# Patient Record
Sex: Female | Born: 1950 | Hispanic: Yes | Smoking: Never smoker
Health system: Southern US, Community
[De-identification: ages and names within clinical notes are randomized; demographics above are authoritative.]

## PROBLEM LIST (undated history)

## (undated) DIAGNOSIS — J8410 Pulmonary fibrosis, unspecified: Secondary | ICD-10-CM

## (undated) DIAGNOSIS — M329 Systemic lupus erythematosus, unspecified: Secondary | ICD-10-CM

## (undated) DIAGNOSIS — I10 Essential (primary) hypertension: Secondary | ICD-10-CM

## (undated) DIAGNOSIS — IMO0002 Reserved for concepts with insufficient information to code with codable children: Secondary | ICD-10-CM

## (undated) DIAGNOSIS — M069 Rheumatoid arthritis, unspecified: Secondary | ICD-10-CM

## (undated) HISTORY — PX: OTHER SURGICAL HISTORY: SHX169

---

## 2015-06-26 ENCOUNTER — Emergency Department (INDEPENDENT_AMBULATORY_CARE_PROVIDER_SITE_OTHER): Payer: Medicare Other

## 2015-06-26 ENCOUNTER — Emergency Department (HOSPITAL_COMMUNITY)
Admission: EM | Admit: 2015-06-26 | Discharge: 2015-06-26 | Disposition: A | Discharge status: Home / Self Care | Payer: Medicare Other | Source: Home / Self Care | Attending: Emergency Medicine | Admitting: Emergency Medicine

## 2015-06-26 ENCOUNTER — Encounter (HOSPITAL_COMMUNITY): Payer: Self-pay | Admitting: Emergency Medicine

## 2015-06-26 DIAGNOSIS — J189 Pneumonia, unspecified organism: Secondary | ICD-10-CM

## 2015-06-26 HISTORY — DX: Reserved for concepts with insufficient information to code with codable children: IMO0002

## 2015-06-26 HISTORY — DX: Rheumatoid arthritis, unspecified: M06.9

## 2015-06-26 HISTORY — DX: Essential (primary) hypertension: I10

## 2015-06-26 HISTORY — DX: Pulmonary fibrosis, unspecified: J84.10

## 2015-06-26 HISTORY — DX: Systemic lupus erythematosus, unspecified: M32.9

## 2015-06-26 MED ORDER — METHYLPREDNISOLONE SODIUM SUCC 125 MG IJ SOLR
INTRAMUSCULAR | Status: AC
  Filled 2015-06-26: qty 2

## 2015-06-26 MED ORDER — METHYLPREDNISOLONE SODIUM SUCC 125 MG IJ SOLR
125.0000 mg | Freq: Once | INTRAMUSCULAR | Status: AC
  Administered 2015-06-26: 125 mg via INTRAMUSCULAR

## 2015-06-26 MED ORDER — LEVOFLOXACIN 500 MG PO TABS: 500.0000 mg | ORAL_TABLET | Freq: Every day | ORAL | Status: DC

## 2015-06-26 NOTE — ED Provider Notes (Signed)
CSN: [id number]     Arrival date & time 06/26/15  1807 History   First MD Initiated Contact with Patient 06/26/15 1821     Chief Complaint  Patient presents with  . Cough   (Consider location/radiation/quality/duration/timing/severity/associated sxs/prior Treatment) Patient is a 64 y.o. female presenting with cough. The history is provided by the patient. No language interpreter was used.  Cough Cough characteristics:  Productive Sputum characteristics:  Green Severity:  Moderate Onset quality:  Gradual Duration:  1 week Timing:  Constant Progression:  Worsening Chronicity:  Recurrent Smoker: no   Context: upper respiratory infection   Relieved by:  Nothing Worsened by:  Nothing tried Ineffective treatments:  None tried Associated symptoms: shortness of breath and sinus congestion   Pt has pulmonary fibrosis.  Pt is on duonebs, augmentin and prednisone  Past Medical History  Diagnosis Date  . Hypertension   . Lupus (HCC)   . Pulmonary fibrosis (HCC)   . Rheumatoid arthritis [facility])    Past Surgical History  Procedure Laterality Date  . Right knee replacement     No family history on file. Social History  Substance Use Topics  . Smoking status: Never Smoker   . Smokeless tobacco: None  . Alcohol Use: No   OB History    No data available     Review of Systems  Respiratory: Positive for cough and shortness of breath.   All other systems reviewed and are negative.   Allergies  Vancomycin  Home Medications   Prior to Admission medications   Medication Sig Start Date End Date Taking? Authorizing Provider  budesonide-formoterol (SYMBICORT) 160-4.5 MCG/ACT inhaler Inhale 2 puffs into the lungs 2 (two) times daily.   Yes Historical Provider, MD  celecoxib (CELEBREX) 200 MG capsule Take 200 mg by mouth 2 (two) times daily.   Yes Historical Provider, MD  clopidogrel (PLAVIX) 75 MG tablet Take 75 mg by mouth daily.   Yes Historical Provider, MD  folic acid (FOLVITE) 1  MG tablet Take 1 mg by mouth daily.   Yes Historical Provider, MD  hydroxychloroquine (PLAQUENIL) 200 MG tablet Take by mouth daily.   Yes Historical Provider, MD  ipratropium-albuterol (DUONEB) 0.5-2.5 (3) MG/3ML SOLN Take 3 mLs by nebulization every 2 (two) hours as needed.   Yes Historical Provider, MD  leflunomide (ARAVA) 20 MG tablet Take 20 mg by mouth daily.   Yes Historical Provider, MD  levothyroxine (SYNTHROID, LEVOTHROID) 100 MCG tablet Take 100 mcg by mouth daily before breakfast.   Yes Historical Provider, MD  pantoprazole (PROTONIX) 20 MG tablet Take 20 mg by mouth daily.   Yes Historical Provider, MD  predniSONE (DELTASONE) 5 MG tablet Take 5 mg by mouth daily with breakfast.   Yes Historical Provider, MD   Meds Ordered and Administered this Visit  Medications - No data to display  BP 123/69 mmHg  Pulse 80  Temp(Src) 97.5 F (36.4 C) (Oral)  Resp 18  SpO2 97% No data found.   Physical Exam  Constitutional: She is oriented to person, place, and time. She appears well-developed and well-nourished.  HENT:  Head: Normocephalic.  Right Ear: External ear normal.  Mouth/Throat: Oropharynx is clear and moist.  Eyes: EOM are normal. Pupils are equal, round, and reactive to light.  Neck: Normal range of motion.  Cardiovascular: Normal rate and normal heart sounds.   Pulmonary/Chest: Effort normal.  Rhonchi, harsh breath sounds  Abdominal: Soft. She exhibits no distension.  Musculoskeletal: Normal range of motion.  Neurological: She  is alert and oriented to person, place, and time.  Skin: Skin is warm.  Psychiatric: She has a normal mood and affect.  Nursing note and vitals reviewed.   ED Course  Procedures (including critical care time)  Labs Review Labs Reviewed - No data to display  Imaging Review Dg Chest 2 View  06/26/2015   CLINICAL DATA:  Cough, history of pulmonary fibrosis  EXAM: CHEST  2 VIEW  COMPARISON:  None.  FINDINGS: There is bibasilar airspace  disease. There is no other focal parenchymal opacity. There is no pleural effusion or pneumothorax. The heart and mediastinal contours are unremarkable.  The osseous structures are unremarkable.  IMPRESSION: Bibasilar airspace disease which may reflect pneumonia versus fibrosis.   Electronically Signed   By: Elige Ko   On: 06/26/2015 19:48     Visual Acuity Review  Right Eye Distance:   Left Eye Distance:   Bilateral Distance:    Right Eye Near:   Left Eye Near:    Bilateral Near:         MDM  Pt may have pneumonia on chest xray.   I will treat with levaquin.  Pt given solumedrol Im.   Pt advised to see her Md as soon as possible for recheck.  Go to ED if symptoms worsen or cahnge.   1. Community acquired pneumonia    Solumedrol IM Levaquin x 10 days    Elson Areas, PA-C 06/26/15 2006

## 2015-06-26 NOTE — Discharge Instructions (Signed)

## 2015-06-26 NOTE — ED Notes (Signed)
Pt here with c/o frequent productive cough with greenish phlegm and worsening SOB at night since last Sunday Pt is here visiting son from [place]. States, she is taking Augmentin, Symbicort and using duo/nebs Q 2hrs per Hx Lupus, Pulmonary Fibrosis. Denies chest pain or fever,chills Last treatment 2 hrs ago, no distress noted

## 2018-03-30 ENCOUNTER — Encounter (HOSPITAL_COMMUNITY): Payer: Self-pay | Admitting: Emergency Medicine

## 2018-03-30 ENCOUNTER — Emergency Department (HOSPITAL_COMMUNITY): Payer: Medicare Other

## 2018-03-30 ENCOUNTER — Observation Stay (HOSPITAL_COMMUNITY)
Admission: EM | Admit: 2018-03-30 | Discharge: 2018-03-31 | Disposition: A | Discharge status: Home / Self Care | Payer: Medicare Other | Attending: Internal Medicine | Admitting: Internal Medicine

## 2018-03-30 ENCOUNTER — Other Ambulatory Visit: Payer: Self-pay

## 2018-03-30 DIAGNOSIS — R05 Cough: Secondary | ICD-10-CM | POA: Insufficient documentation

## 2018-03-30 DIAGNOSIS — I272 Pulmonary hypertension, unspecified: Secondary | ICD-10-CM

## 2018-03-30 DIAGNOSIS — M3210 Systemic lupus erythematosus, organ or system involvement unspecified: Secondary | ICD-10-CM | POA: Insufficient documentation

## 2018-03-30 DIAGNOSIS — R079 Chest pain, unspecified: Secondary | ICD-10-CM | POA: Diagnosis not present

## 2018-03-30 DIAGNOSIS — I10 Essential (primary) hypertension: Secondary | ICD-10-CM | POA: Diagnosis not present

## 2018-03-30 DIAGNOSIS — J189 Pneumonia, unspecified organism: Secondary | ICD-10-CM | POA: Diagnosis not present

## 2018-03-30 DIAGNOSIS — Z79899 Other long term (current) drug therapy: Secondary | ICD-10-CM | POA: Diagnosis not present

## 2018-03-30 DIAGNOSIS — Z7902 Long term (current) use of antithrombotics/antiplatelets: Secondary | ICD-10-CM | POA: Insufficient documentation

## 2018-03-30 DIAGNOSIS — M069 Rheumatoid arthritis, unspecified: Secondary | ICD-10-CM | POA: Insufficient documentation

## 2018-03-30 DIAGNOSIS — J8410 Pulmonary fibrosis, unspecified: Secondary | ICD-10-CM

## 2018-03-30 LAB — BASIC METABOLIC PANEL
Anion gap: 4 — ABNORMAL LOW (ref 5–15)
BUN: 18 mg/dL (ref 8–23)
CO2: 26 mmol/L (ref 22–32)
CREATININE: 0.54 mg/dL (ref 0.44–1.00)
Calcium: 8.3 mg/dL — ABNORMAL LOW (ref 8.9–10.3)
Chloride: 108 mmol/L (ref 98–111)
GFR calc Af Amer: >60 mL/min (ref >60)
Glucose, Bld: 82 mg/dL (ref 70–99)
Potassium: 2.9 mmol/L — ABNORMAL LOW (ref 3.5–5.1)
Sodium: 138 mmol/L (ref 135–145)

## 2018-03-30 LAB — CBC
HEMATOCRIT: 39.4 % (ref 36.0–46.0)
Hemoglobin: 12.4 g/dL (ref 12.0–15.0)
MCH: 29.2 pg (ref 26.0–34.0)
MCHC: 31.5 g/dL (ref 30.0–36.0)
MCV: 92.9 fL (ref 78.0–100.0)
Platelets: 295 10*3/uL (ref 150–400)
RBC: 4.24 MIL/uL (ref 3.87–5.11)
RDW: 13.2 % (ref 11.5–15.5)
WBC: 6.8 10*3/uL (ref 4.0–10.5)

## 2018-03-30 LAB — I-STAT TROPONIN, ED: TROPONIN I, POC: 0.02 ng/mL (ref 0.00–0.08)

## 2018-03-30 LAB — C-REACTIVE PROTEIN: CRP: 4.8 mg/dL — AB (ref <1.0)

## 2018-03-30 LAB — SEDIMENTATION RATE: SED RATE: 14 mm/h (ref 0–22)

## 2018-03-30 LAB — TROPONIN I: Troponin I: <0.03 ng/mL (ref <0.03)

## 2018-03-30 LAB — LACTIC ACID, PLASMA
Lactic Acid, Venous: 1.5 mmol/L (ref 0.5–1.9)
Lactic Acid, Venous: 1.7 mmol/L (ref 0.5–1.9)

## 2018-03-30 MED ORDER — PREDNISONE 20 MG PO TABS
20.0000 mg | ORAL_TABLET | Freq: Every day | ORAL | Status: DC
  Administered 2018-03-31: 20 mg via ORAL
  Filled 2018-03-30: qty 1

## 2018-03-30 MED ORDER — POTASSIUM CHLORIDE CRYS ER 20 MEQ PO TBCR
40.0000 meq | EXTENDED_RELEASE_TABLET | Freq: Once | ORAL | Status: AC
  Administered 2018-03-30: 40 meq via ORAL
  Filled 2018-03-30: qty 2

## 2018-03-30 MED ORDER — HYDROXYCHLOROQUINE SULFATE 200 MG PO TABS
400.0000 mg | ORAL_TABLET | Freq: Every day | ORAL | Status: DC
  Administered 2018-03-31: 400 mg via ORAL
  Filled 2018-03-30: qty 2

## 2018-03-30 MED ORDER — CLOPIDOGREL BISULFATE 75 MG PO TABS
75.0000 mg | ORAL_TABLET | Freq: Every day | ORAL | Status: DC
  Administered 2018-03-31: 75 mg via ORAL
  Filled 2018-03-30: qty 1

## 2018-03-30 MED ORDER — MORPHINE SULFATE (PF) 4 MG/ML IV SOLN
2.0000 mg | Freq: Once | INTRAVENOUS | Status: AC
  Administered 2018-03-30: 2 mg via INTRAVENOUS
  Filled 2018-03-30: qty 1

## 2018-03-30 MED ORDER — IOPAMIDOL (ISOVUE-370) INJECTION 76%
80.0000 mL | Freq: Once | INTRAVENOUS | Status: AC | PRN
  Administered 2018-03-30: 100 mL via INTRAVENOUS

## 2018-03-30 MED ORDER — CELECOXIB 200 MG PO CAPS
200.0000 mg | ORAL_CAPSULE | Freq: Two times a day (BID) | ORAL | Status: DC
  Administered 2018-03-30 – 2018-03-31 (×2): 200 mg via ORAL
  Filled 2018-03-30 (×3): qty 1

## 2018-03-30 MED ORDER — IPRATROPIUM BROMIDE 0.02 % IN SOLN: 0.5000 mg | Freq: Two times a day (BID) | RESPIRATORY_TRACT | Status: DC | PRN

## 2018-03-30 MED ORDER — IOPAMIDOL (ISOVUE-370) INJECTION 76%
INTRAVENOUS | Status: AC
  Filled 2018-03-30: qty 100

## 2018-03-30 MED ORDER — LEFLUNOMIDE 20 MG PO TABS
20.0000 mg | ORAL_TABLET | Freq: Every day | ORAL | Status: DC
  Administered 2018-03-31: 20 mg via ORAL
  Filled 2018-03-30: qty 1

## 2018-03-30 MED ORDER — MOMETASONE FURO-FORMOTEROL FUM 200-5 MCG/ACT IN AERO
2.0000 | INHALATION_SPRAY | Freq: Two times a day (BID) | RESPIRATORY_TRACT | Status: DC
  Administered 2018-03-30: 2 via RESPIRATORY_TRACT
  Filled 2018-03-30: qty 8.8

## 2018-03-30 MED ORDER — LEVOTHYROXINE SODIUM 100 MCG PO TABS
100.0000 ug | ORAL_TABLET | Freq: Every day | ORAL | Status: DC
  Administered 2018-03-31: 100 ug via ORAL
  Filled 2018-03-30: qty 1

## 2018-03-30 MED ORDER — AMLODIPINE BESYLATE 5 MG PO TABS
5.0000 mg | ORAL_TABLET | Freq: Every day | ORAL | Status: DC
  Administered 2018-03-31: 5 mg via ORAL
  Filled 2018-03-30: qty 1

## 2018-03-30 MED ORDER — LEVOFLOXACIN 750 MG PO TABS
750.0000 mg | ORAL_TABLET | Freq: Every day | ORAL | Status: AC
  Administered 2018-03-30: 750 mg via ORAL
  Filled 2018-03-30: qty 1

## 2018-03-30 NOTE — ED Notes (Signed)
Transported to radiology via stretcher per rad tech  

## 2018-03-30 NOTE — H&P (Addendum)
History and Physical    Melody Gregory GXQ:[id number] DOB: 1950/12/27 DOA: 03/30/2018  I have briefly reviewed the patient's prior medical records in [facility]  PCP: Patient, No Pcp Per  Patient coming from: home  Chief Complaint: Chest pain  HPI: Melody Gregory is a 67 y.o. female with medical history significant of pulmonary fibrosis, lupus, rheumatoid arthritis, resides in [place] but currently visiting with her son, presents to the hospital with chief complaint of chest pain.  Patient tells me that she was diagnosed with pneumonia about a week ago when she presented to urgent care with cough and shortness of breath and sinus congestion, she has been on Augmentin for about 5 days, felt like she did not improve, went again yesterday and was changed to Levaquin.  Chest x-ray whether she tells me that she had multifocal pneumonia.  I do not have access to those results.  She comes to the hospital today due to progressive central and right-sided chest pain, worse with deep breath and which seems positional in nature.  She denies any fever chills, she denies any abdominal pain, nausea or vomiting.  She feels like her URI type symptoms with cough and congestion are a little bit better.  She denies any history of coronary artery disease.  She is on prednisone at home, 5 mg, and she self increased the dose to 10mg  given acute illness in her instructions from her PCP in the past.  ED Course: In the emergency room she is afebrile, has no leukocytosis, blood work is relatively unremarkable except for potassium of 2.9.  Lactic acid is normal, troponin is negative.  CT angiogram was negative for PE, also did not show any significant findings consistent with pneumonia.  She does have a very small right-sided pleural effusion.  Review of Systems: As per HPI otherwise 10 point review of systems negative.   Past Medical History:  Diagnosis Date  . Hypertension   . Lupus (HCC)   . Pulmonary fibrosis  (HCC)   . Rheumatoid arthritis (HCC)     Past Surgical History:  Procedure Laterality Date  . right knee replacement       reports that she has never smoked. She does not have any smokeless tobacco history on file. She reports that she does not drink alcohol or use drugs.  Allergies  Allergen Reactions  . Vancomycin Rash    No family history on file.  Prior to Admission medications   Medication Sig Start Date End Date Taking? Authorizing Provider  Adalimumab (HUMIRA) 40 MG/0.4ML PSKT Inject into the skin.   Yes [provider]  amLODipine (NORVASC) 5 MG tablet Take 5 mg by mouth daily.   Yes [provider]  budesonide-formoterol (SYMBICORT) 160-4.5 MCG/ACT inhaler Inhale 2 puffs into the lungs 2 (two) times daily.   Yes [provider]  carboxymethylcellulose (REFRESH PLUS) 0.5 % SOLN Place 1 drop into both eyes as needed (dry eyes).   Yes [provider]  celecoxib (CELEBREX) 200 MG capsule Take 200 mg by mouth 2 (two) times daily.   Yes [provider]  clopidogrel (PLAVIX) 75 MG tablet Take 75 mg by mouth daily.   Yes [provider]  Coenzyme Q10-Fish Oil-Vit E (CO-Q 10 OMEGA-3 FISH OIL) CAPS Take 1 capsule by mouth daily.   Yes [provider]  ergocalciferol (VITAMIN D2) 50000 units capsule Take 50,000 Units by mouth once a week.   Yes [provider]  folic acid (FOLVITE) 1 MG tablet  Take 1 mg by mouth daily.   Yes [provider]  hydroxychloroquine (PLAQUENIL) 200 MG tablet Take 400 mg by mouth daily.    Yes [provider]  ipratropium (ATROVENT) 0.02 % nebulizer solution Take 0.5 mg by nebulization 2 (two) times daily as needed for wheezing or shortness of breath.   Yes [provider]  leflunomide (ARAVA) 20 MG tablet Take 20 mg by mouth daily.   Yes [provider]  levofloxacin (LEVAQUIN) 750 MG tablet Take 750 mg by mouth daily.   Yes [provider]    levothyroxine (SYNTHROID, LEVOTHROID) 100 MCG tablet Take 100 mcg by mouth daily before breakfast.   Yes [provider]  Multiple Vitamins-Minerals (AIRBORNE PO) Take 1 tablet by mouth daily.   Yes [provider]  OXYGEN Inhale 2 L into the lungs at bedtime.   Yes [provider]  pantoprazole (PROTONIX) 20 MG tablet Take 20 mg by mouth daily.   Yes [provider]  predniSONE (DELTASONE) 5 MG tablet Take 5 mg by mouth daily with breakfast.   Yes [provider]    Physical Exam: Vitals:   03/30/18 0950 03/30/18 1000 03/30/18 1100 03/30/18 1232  BP: 125/60 125/61 128/63 125/64  Pulse: 73 71 67 66  Resp: 20 20 (!) 30 20  Temp:      TempSrc:      SpO2: 96% 94% 95% 97%      Constitutional: NAD, calm, comfortable Eyes: PERRL, lids and conjunctivae normal ENMT: Mucous membranes are moist. Neck: normal, supple Respiratory: velcro type sounds bilaterally, no wheezing, no crackles Normal respiratory effort. No accessory muscle use.  Cardiovascular: Regular rate and rhythm, no murmurs / rubs / gallops.  Abdomen: no tenderness, no masses palpated. .  Musculoskeletal: RA changes on hands Skin: no rashes, lesions, ulcers. No induration Neurologic: CN 2-12 grossly intact. Strength 5/5 in all 4.  Psychiatric: Normal judgment and insight. Alert and oriented x 3. Normal mood.   Labs on Admission: I have personally reviewed following labs and imaging studies  CBC: Recent Labs  Lab 03/30/18 0905  WBC 6.8  HGB 12.4  HCT 39.4  MCV 92.9  PLT 295   Basic Metabolic Panel: Recent Labs  Lab 03/30/18 0905  NA 138  K 2.9*  CL 108  CO2 26  GLUCOSE 82  BUN 18  CREATININE 0.54  CALCIUM 8.3*   GFR: CrCl cannot be calculated (Unknown ideal weight.). Liver Function Tests: No results for input(s): AST, ALT, ALKPHOS, BILITOT, PROT, ALBUMIN in the last 168 hours. No results for input(s): LIPASE, AMYLASE in the last 168 hours. No results for  input(s): AMMONIA in the last 168 hours. Coagulation Profile: No results for input(s): INR, PROTIME in the last 168 hours. Cardiac Enzymes: No results for input(s): CKTOTAL, CKMB, CKMBINDEX, TROPONINI in the last 168 hours. BNP (last 3 results) No results for input(s): PROBNP in the last 8760 hours. HbA1C: No results for input(s): HGBA1C in the last 72 hours. CBG: No results for input(s): GLUCAP in the last 168 hours. Lipid Profile: No results for input(s): CHOL, HDL, LDLCALC, TRIG, CHOLHDL, LDLDIRECT in the last 72 hours. Thyroid Function Tests: No results for input(s): TSH, T4TOTAL, FREET4, T3FREE, THYROIDAB in the last 72 hours. Anemia Panel: No results for input(s): VITAMINB12, FOLATE, FERRITIN, TIBC, IRON, RETICCTPCT in the last 72 hours. Urine analysis: No results found for: COLORURINE, APPEARANCEUR, LABSPEC, PHURINE, GLUCOSEU, HGBUR, BILIRUBINUR, KETONESUR, PROTEINUR, UROBILINOGEN, NITRITE, LEUKOCYTESUR   Radiological Exams on Admission:  Dg Chest 2 View  Result Date: 03/30/2018 CLINICAL DATA:  Pneumonia, chest pain EXAM: CHEST - 2 VIEW COMPARISON:  06/26/2015 chest radiograph. FINDINGS: Stable cardiomediastinal silhouette with normal heart size. No pneumothorax. No pleural effusion. No pulmonary edema. Reticular and curvilinear opacities at the lung bases are unchanged. No acute consolidative airspace disease. IMPRESSION: No acute cardiopulmonary disease. Chronic bibasilar reticular and curvilinear opacities suggesting nonspecific scarring or chronic interstitial lung disease. Electronically Signed   By: Delbert Phenix M.D.   On: 03/30/2018 09:40   Ct Angio Chest Pe W/cm &/or Wo Cm  Result Date: 03/30/2018 CLINICAL DATA:  Shortness of breath EXAM: CT ANGIOGRAPHY CHEST WITH CONTRAST TECHNIQUE: Multidetector CT imaging of the chest was performed using the standard protocol during bolus administration of intravenous contrast. Multiplanar CT image reconstructions and MIPs were obtained to  evaluate the vascular anatomy. CONTRAST:  ISOVUE-370 IOPAMIDOL (ISOVUE-370) INJECTION 76% COMPARISON:  Chest radiograph March 30, 2018 FINDINGS: Cardiovascular: There is no demonstrable pulmonary embolus. There is no thoracic aortic aneurysm or dissection. Visualized great vessels appear unremarkable. There are foci of atherosclerotic calcification in the aorta. There are occasional foci of coronary artery calcification. There is no pericardial effusion or pericardial thickening evident. The main pulmonary outflow tract is prominent measuring 3.0 cm in diameter. Mediastinum/Nodes: No thyroid lesions evident. There is no appreciable thoracic adenopathy. Air is present throughout much of the esophagus. Radiopaque material is noted in the stomach and midesophagus, likely indicative of gastroesophageal reflux. Lungs/Pleura: There is a right pleural effusion with atelectatic change in the right base. There is patchy fibrotic change throughout the lungs bilaterally. There is no consolidation. Areas of patchy mosaic attenuation likely represent small airways obstructive disease. Upper Abdomen: Visualized upper abdominal structures appear unremarkable except for calcified granulomas in the spleen. Musculoskeletal: There are no blastic or lytic bone lesions. No chest wall lesions are appreciable. Review of the MIP images confirms the above findings. IMPRESSION: 1. No demonstrable pulmonary embolus. No thoracic aortic aneurysm or dissection. There is aortic atherosclerosis. There are occasional foci of coronary artery calcification. 2. Patchy fibrosis in the lungs. Small right pleural effusion with right base atelectasis. Areas of probable small airways obstructive disease. No frank consolidation. 3.  No evident adenopathy. 4. Air throughout much of the esophagus. Evidence of spontaneous gastroesophageal reflux. 5. Main pulmonary outflow tract is prominent measuring 3.0 cm in diameter. Suspect a degree of underlying  pulmonary arterial hypertension. Aortic Atherosclerosis (ICD10-I70.0). Electronically Signed   By: Bretta Bang III M.D.   On: 03/30/2018 13:21    EKG: Independently reviewed.  Sinus rhythm, right bundle branch block, nonspecific T wave changes  Assessment/Plan Active Problems:   Chest pain   Chest pain -Sounds pleuritic in nature, wonder whether this may represents pericarditis given recent URI type illness.  She also has a right small pleural effusion which can also explain her symptoms. -She does have risk factors for heart disease given rheumatoid arthritis and will cycle cardiac enzymes overnight  -check a 2D echo  Rheumatoid arthritis -On chronic prednisone, increased dose to 20 mg, will need a quick taper  Possible community-acquired pneumonia -She has received 5 days of Augmentin, 1 dose of Levaquin, missed today's dose.  Will give 1 Levaquin today and stop given a total of 7-day course -Patient has been having recurrent "pneumonia" every time she comes to [place].  Advised her to follow-up with her pulmonologist, eventually ask for a immunology allergology referral in case this is allergic in nature rather  than infectious every time she travels outside [place]  Lupus -Continue home medications, check sed rate and CRP, check complements to ensure this is not a lupus flareup  Pulmonary fibrosis -Continue home inhalers  Hypertension -Continue home medications   DVT prophylaxis: SCDs  Code Status: Full code  Family Communication: son present at bedside Disposition Plan: home 1 day pending negative troponin and 2D echo results Consults called: none     Admission status: observation   At the point of initial evaluation, it is my clinical opinion that admission for OBSERVATION is reasonable and necessary because the patient's presenting complaints in the context of their chronic conditions represent sufficient risk of deterioration or significant morbidity to  constitute reasonable grounds for close observation in the hospital setting, but that the patient may be medically stable for discharge from the hospital within 24 to 48 hours.   Pamella Pert, MD [facility] Pager [phone]  If 7PM-7AM, please contact night-coverage [email or web address] Password [email or web address]  03/30/2018, 2:42 PM

## 2018-03-30 NOTE — ED Notes (Signed)
Report called to floor

## 2018-03-30 NOTE — ED Triage Notes (Signed)
Pt reports she was diagnosed with double pneumonia on Friday at urgent care, placed on levaquin but reports that she is having worse chest pain now than before, denies fevers or chills. resp e/u, nad

## 2018-03-30 NOTE — Plan of Care (Signed)
  Problem: Education: Goal: Understanding of cardiac disease, CV risk reduction, and recovery process will improve Outcome: Completed/Met   Problem: Activity: Goal: Ability to tolerate increased activity will improve Outcome: Completed/Met

## 2018-03-30 NOTE — ED Notes (Signed)
Transported to CT scan via stretcher per rad tech  

## 2018-03-30 NOTE — ED Notes (Signed)
Transported from radiology via stretcher per rad tech  

## 2018-03-30 NOTE — ED Provider Notes (Signed)
[facility] [facility] PROGRESSIVE CARE Provider Note   CSN: [id number] Arrival date & time: 03/30/18  0856     History   Chief Complaint Chief Complaint  Patient presents with  . Pneumonia  . Chest Pain    HPI Melody Gregory is a 67 y.o. female.  67 year old female with prior history of lupus, rheumatoid arthritis, and pulmonary fibrosis presents with complaint of chest pain.  Patient reports that she has had ongoing cough for the last week and a half.  She was initially placed on Augmentin until 3 days ago when she was placed on Levaquin.  Patient presents today complaining of continued cough but now with substernal chest discomfort.  This discomfort started this morning.  She denies a prior history of CAD.  The history is provided by the patient.  Chest Pain   This is a new problem. The current episode started 3 to 5 hours ago. The problem occurs rarely. The problem has been gradually improving. The pain is present in the substernal region. The pain is mild. The quality of the pain is described as sharp and pressure-like. The pain does not radiate.    Past Medical History:  Diagnosis Date  . Hypertension   . Lupus (HCC)   . Pulmonary fibrosis (HCC)   . Rheumatoid arthritis [facility])     Patient Active Problem List   Diagnosis Date Noted  . Chest pain 03/30/2018    Past Surgical History:  Procedure Laterality Date  . right knee replacement       OB History   None      Home Medications    Prior to Admission medications   Medication Sig Start Date End Date Taking? Authorizing Provider  Adalimumab (HUMIRA) 40 MG/0.4ML PSKT Inject into the skin.   Yes [provider]  amLODipine (NORVASC) 5 MG tablet Take 5 mg by mouth daily.   Yes [provider]  budesonide-formoterol (SYMBICORT) 160-4.5 MCG/ACT inhaler Inhale 2 puffs into the lungs 2 (two) times daily.   Yes [provider]  carboxymethylcellulose (REFRESH PLUS) 0.5 % SOLN Place 1 drop into  both eyes as needed (dry eyes).   Yes [provider]  celecoxib (CELEBREX) 200 MG capsule Take 200 mg by mouth 2 (two) times daily.   Yes [provider]  clopidogrel (PLAVIX) 75 MG tablet Take 75 mg by mouth daily.   Yes [provider]  Coenzyme Q10-Fish Oil-Vit E (CO-Q 10 OMEGA-3 FISH OIL) CAPS Take 1 capsule by mouth daily.   Yes [provider]  ergocalciferol (VITAMIN D2) 50000 units capsule Take 50,000 Units by mouth once a week.   Yes [provider]  folic acid (FOLVITE) 1 MG tablet Take 1 mg by mouth daily.   Yes [provider]  hydroxychloroquine (PLAQUENIL) 200 MG tablet Take 400 mg by mouth daily.    Yes [provider]  ipratropium (ATROVENT) 0.02 % nebulizer solution Take 0.5 mg by nebulization 2 (two) times daily as needed for wheezing or shortness of breath.   Yes [provider]  leflunomide (ARAVA) 20 MG tablet Take 20 mg by mouth daily.   Yes [provider]  levofloxacin (LEVAQUIN) 750 MG tablet Take 750 mg by mouth daily.   Yes [provider]  levothyroxine (SYNTHROID, LEVOTHROID) 100 MCG tablet Take 100 mcg by mouth daily before breakfast.   Yes [provider]  Multiple Vitamins-Minerals (AIRBORNE PO) Take 1 tablet by mouth daily.   Yes [provider]  OXYGEN Inhale 2 L into the lungs at bedtime.   Yes [provider]  pantoprazole (PROTONIX) 20 MG tablet Take 20 mg by mouth daily.   Yes [provider]  predniSONE (DELTASONE) 5 MG tablet Take 5 mg by mouth daily with breakfast.   Yes [provider]    Family History No family history on file.  Social History Social History   Tobacco Use  . Smoking status: Never Smoker  Substance Use Topics  . Alcohol use: No  . Drug use: No     Allergies   Vancomycin   Review of Systems Review of Systems  Cardiovascular: Positive for chest pain.  All other systems reviewed and are  negative.    Physical Exam Updated Vital Signs BP 129/69 (BP Location: Right Arm)   Pulse 75   Temp 98.1 F (36.7 C) (Oral)   Resp 20   Ht 5\' 2"  (1.575 m)   Wt 51.7 kg (114 lb)   SpO2 96%   BMI 20.85 kg/m   Physical Exam  Constitutional: She is oriented to person, place, and time. She appears well-developed and well-nourished. No distress.  HENT:  Head: Normocephalic and atraumatic.  Mouth/Throat: Oropharynx is clear and moist.  Eyes: Pupils are equal, round, and reactive to light. Conjunctivae and EOM are normal.  Neck: Normal range of motion. Neck supple.  Cardiovascular: Normal rate, regular rhythm and normal heart sounds.  Pulmonary/Chest: Effort normal and breath sounds normal. No respiratory distress.  Abdominal: Soft. She exhibits no distension. There is no tenderness.  Musculoskeletal: Normal range of motion. She exhibits no edema or deformity.  Neurological: She is alert and oriented to person, place, and time.  Skin: Skin is warm and dry.  Psychiatric: She has a normal mood and affect.  Nursing note and vitals reviewed.    ED Treatments / Results  Labs (all labs ordered are listed, but only abnormal results are displayed) Labs Reviewed  BASIC METABOLIC PANEL - Abnormal; Notable for the following components:      Result Value   Potassium 2.9 (*)    Calcium 8.3 (*)    Anion gap 4 (*)    All other components within normal limits  CULTURE, BLOOD (ROUTINE X 2)  CULTURE, BLOOD (ROUTINE X 2)  CBC  LACTIC ACID, PLASMA  LACTIC ACID, PLASMA  HIV ANTIBODY (ROUTINE TESTING)  TROPONIN I  TROPONIN I  TROPONIN I  SEDIMENTATION RATE  C-REACTIVE PROTEIN  C3 COMPLEMENT  C4 COMPLEMENT  I-STAT TROPONIN, ED    EKG EKG Interpretation  Date/Time:  Sunday March 30 2018 08:59:53 EDT Ventricular Rate:  78 PR Interval:  158 QRS Duration: 130 QT Interval:  384 QTC Calculation: 437 R Axis:   112 Text Interpretation:  Normal sinus rhythm Possible Left atrial  enlargement Right bundle branch block T wave abnormality, consider inferior ischemia Abnormal ECG Confirmed by 02-27-1990 [phone]) on 03/30/2018 9:09:25 AM Also confirmed by 05/31/2018 [phone]), editor ([id number] (Josephine Igo)  on 03/30/2018 9:34:09 AM   Radiology Dg Chest 2 View  Result Date: 03/30/2018 CLINICAL DATA:  Pneumonia, chest pain EXAM: CHEST - 2 VIEW COMPARISON:  06/26/2015 chest radiograph. FINDINGS: Stable cardiomediastinal silhouette with normal heart size. No pneumothorax. No pleural effusion. No pulmonary edema. Reticular and curvilinear opacities at the lung bases are unchanged. No acute consolidative airspace disease. IMPRESSION: No acute cardiopulmonary disease. Chronic bibasilar reticular and curvilinear opacities suggesting nonspecific scarring or chronic interstitial lung disease. Electronically Signed   By: 08/26/2015  Poff M.D.   On: 03/30/2018 09:40   Ct Angio Chest Pe W/cm &/or Wo Cm  Result Date: 03/30/2018 CLINICAL DATA:  Shortness of breath EXAM: CT ANGIOGRAPHY CHEST WITH CONTRAST TECHNIQUE: Multidetector CT imaging of the chest was performed using the standard protocol during bolus administration of intravenous contrast. Multiplanar CT image reconstructions and MIPs were obtained to evaluate the vascular anatomy. CONTRAST:  ISOVUE-370 IOPAMIDOL (ISOVUE-370) INJECTION 76% COMPARISON:  Chest radiograph March 30, 2018 FINDINGS: Cardiovascular: There is no demonstrable pulmonary embolus. There is no thoracic aortic aneurysm or dissection. Visualized great vessels appear unremarkable. There are foci of atherosclerotic calcification in the aorta. There are occasional foci of coronary artery calcification. There is no pericardial effusion or pericardial thickening evident. The main pulmonary outflow tract is prominent measuring 3.0 cm in diameter. Mediastinum/Nodes: No thyroid lesions evident. There is no appreciable thoracic adenopathy. Air is present throughout much of the  esophagus. Radiopaque material is noted in the stomach and midesophagus, likely indicative of gastroesophageal reflux. Lungs/Pleura: There is a right pleural effusion with atelectatic change in the right base. There is patchy fibrotic change throughout the lungs bilaterally. There is no consolidation. Areas of patchy mosaic attenuation likely represent small airways obstructive disease. Upper Abdomen: Visualized upper abdominal structures appear unremarkable except for calcified granulomas in the spleen. Musculoskeletal: There are no blastic or lytic bone lesions. No chest wall lesions are appreciable. Review of the MIP images confirms the above findings. IMPRESSION: 1. No demonstrable pulmonary embolus. No thoracic aortic aneurysm or dissection. There is aortic atherosclerosis. There are occasional foci of coronary artery calcification. 2. Patchy fibrosis in the lungs. Small right pleural effusion with right base atelectasis. Areas of probable small airways obstructive disease. No frank consolidation. 3.  No evident adenopathy. 4. Air throughout much of the esophagus. Evidence of spontaneous gastroesophageal reflux. 5. Main pulmonary outflow tract is prominent measuring 3.0 cm in diameter. Suspect a degree of underlying pulmonary arterial hypertension. Aortic Atherosclerosis (ICD10-I70.0). Electronically Signed   By: Bretta Bang III M.D.   On: 03/30/2018 13:21    Procedures Procedures (including critical care time)  Medications Ordered in ED Medications  iopamidol (ISOVUE-370) 76 % injection (has no administration in time range)  amLODipine (NORVASC) tablet 5 mg (has no administration in time range)  mometasone-formoterol (DULERA) 200-5 MCG/ACT inhaler 2 puff (has no administration in time range)  celecoxib (CELEBREX) capsule 200 mg (has no administration in time range)  clopidogrel (PLAVIX) tablet 75 mg (has no administration in time range)  hydroxychloroquine (PLAQUENIL) tablet 400 mg (has no  administration in time range)  ipratropium (ATROVENT) nebulizer solution 0.5 mg (has no administration in time range)  leflunomide (ARAVA) tablet 20 mg (has no administration in time range)  levothyroxine (SYNTHROID, LEVOTHROID) tablet 100 mcg (has no administration in time range)  predniSONE (DELTASONE) tablet 20 mg (20 mg Oral Not Given 03/30/18 1559)  morphine 4 MG/ML injection 2 mg (2 mg Intravenous Given 03/30/18 0949)  potassium chloride SA (K-DUR,KLOR-CON) CR tablet 40 mEq (40 mEq Oral Given 03/30/18 1228)  iopamidol (ISOVUE-370) 76 % injection 80 mL (100 mLs Intravenous Contrast Given 03/30/18 1243)  levofloxacin (LEVAQUIN) tablet 750 mg (750 mg Oral Given 03/30/18 1603)  potassium chloride SA (K-DUR,KLOR-CON) CR tablet 40 mEq (40 mEq Oral Given 03/30/18 1604)     Initial Impression / Assessment and Plan / ED Course  I have reviewed the triage vital signs and the nursing notes.  Pertinent labs & imaging results that were available during my  care of the patient were reviewed by me and considered in my medical decision making (see chart for details).     MDM  Screen Complete  She is presenting with complaint of chest pain.  Initial EKG is without evidence of acute ischemia.  Initial troponin is negative.  Other screening labs are without significant abnormality.  CTA of chest does not demonstrate PE.  No evidence of pneumonia on CT either.   Noted hypokalemia was treated in the ED.   Patient will be admitted to the hospitalist service for further evaluation and work-up of chest pain.   Final Clinical Impressions(s) / ED Diagnoses   Final diagnoses:  Chest pain, unspecified type    ED Discharge Orders    None       Wynetta Fines, MD 03/30/18 [phone]

## 2018-03-31 ENCOUNTER — Telehealth: Payer: Self-pay | Admitting: Physician Assistant

## 2018-03-31 ENCOUNTER — Encounter (HOSPITAL_COMMUNITY): Payer: Self-pay

## 2018-03-31 ENCOUNTER — Observation Stay (HOSPITAL_BASED_OUTPATIENT_CLINIC_OR_DEPARTMENT_OTHER): Payer: Medicare Other

## 2018-03-31 DIAGNOSIS — R079 Chest pain, unspecified: Secondary | ICD-10-CM | POA: Diagnosis not present

## 2018-03-31 DIAGNOSIS — J189 Pneumonia, unspecified organism: Secondary | ICD-10-CM | POA: Diagnosis not present

## 2018-03-31 DIAGNOSIS — I5030 Unspecified diastolic (congestive) heart failure: Secondary | ICD-10-CM

## 2018-03-31 DIAGNOSIS — I272 Pulmonary hypertension, unspecified: Secondary | ICD-10-CM

## 2018-03-31 DIAGNOSIS — J8410 Pulmonary fibrosis, unspecified: Secondary | ICD-10-CM

## 2018-03-31 DIAGNOSIS — R0781 Pleurodynia: Secondary | ICD-10-CM | POA: Diagnosis not present

## 2018-03-31 LAB — LIPID PANEL
Cholesterol: 195 mg/dL (ref 0–200)
HDL: 85 mg/dL (ref >40)
LDL CALC: 101 mg/dL — AB (ref 0–99)
Total CHOL/HDL Ratio: 2.3 RATIO
Triglycerides: 47 mg/dL (ref <150)
VLDL: 9 mg/dL (ref 0–40)

## 2018-03-31 LAB — BASIC METABOLIC PANEL
ANION GAP: 5 (ref 5–15)
BUN: 9 mg/dL (ref 8–23)
CALCIUM: 8.7 mg/dL — AB (ref 8.9–10.3)
CO2: 27 mmol/L (ref 22–32)
CREATININE: 0.44 mg/dL (ref 0.44–1.00)
Chloride: 110 mmol/L (ref 98–111)
GFR calc Af Amer: >60 mL/min (ref >60)
GLUCOSE: 86 mg/dL (ref 70–99)
Potassium: 3.9 mmol/L (ref 3.5–5.1)
Sodium: 142 mmol/L (ref 135–145)

## 2018-03-31 LAB — C4 COMPLEMENT: COMPLEMENT C4, BODY FLUID: 21 mg/dL (ref 14–44)

## 2018-03-31 LAB — ECHOCARDIOGRAM COMPLETE
HEIGHTINCHES: 62 in
Weight: 1833.6 oz

## 2018-03-31 LAB — C3 COMPLEMENT: C3 COMPLEMENT: 109 mg/dL (ref 82–167)

## 2018-03-31 LAB — TROPONIN I: Troponin I: <0.03 ng/mL (ref <0.03)

## 2018-03-31 LAB — HIV ANTIBODY (ROUTINE TESTING W REFLEX): HIV Screen 4th Generation wRfx: NONREACTIVE

## 2018-03-31 MED ORDER — ALUM & MAG HYDROXIDE-SIMETH 200-200-20 MG/5ML PO SUSP
30.0000 mL | ORAL | Status: DC | PRN
  Administered 2018-03-31 (×2): 30 mL via ORAL
  Filled 2018-03-31 (×2): qty 30

## 2018-03-31 MED ORDER — ROSUVASTATIN CALCIUM 10 MG PO TABS: 5.0000 mg | ORAL_TABLET | Freq: Every day | ORAL | 11 refills | Status: AC

## 2018-03-31 MED ORDER — CELECOXIB 200 MG PO CAPS: 200.0000 mg | ORAL_CAPSULE | Freq: Two times a day (BID) | ORAL | 0 refills | Status: AC

## 2018-03-31 NOTE — Progress Notes (Deleted)
PROGRESS NOTE    Mayrin Schmuck  PIR:[id number] DOB: 1950/10/14 DOA: 03/30/2018 PCP: Patient, No Pcp Per   Brief Narrative: Assessment & Plan:   Active Problems:   Pleuritic chest pain   Pulmonary hypertension, unspecified (HCC)   Pulmonary fibrosis (HCC)   Community acquired pneumonia of right lung     DVT prophylaxis:Code Status:  Family Communication: Disposition Plan:   Consultants:    Procedures: Antimicrobials:  Subjective:   Objective: Vitals:   03/30/18 1617 03/30/18 1922 03/31/18 0454 03/31/18 0743  BP: 129/69 112/62 (!) 148/69 (!) 144/71  Pulse: 75 66 68 64  Resp:  18 (!) 22 16  Temp: 98.1 F (36.7 C) 98.1 F (36.7 C) 98.5 F (36.9 C)   TempSrc: Oral Oral Oral   SpO2: 96% 95% 94%   Weight: 51.7 kg (114 lb)  52 kg (114 lb 9.6 oz)   Height: 5\' 2"  (1.575 m)       Intake/Output Summary (Last 24 hours) at 03/31/2018 1203 Last data filed at 03/30/2018 2110 Gross per 24 hour  Intake 600 ml  Output -  Net 600 ml   Filed Weights   03/30/18 1617 03/31/18 0454  Weight: 51.7 kg (114 lb) 52 kg (114 lb 9.6 oz)    Examination:  General exam: Appears calm and comfortable  Respiratory system: Clear to auscultation. Respiratory effort normal. Cardiovascular system: S1 & S2 heard, RRR. No JVD, murmurs, rubs, gallops or clicks. No pedal edema. Gastrointestinal system: Abdomen is nondistended, soft and nontender. No organomegaly or masses felt. Normal bowel sounds heard. Central nervous system: Alert and oriented. No focal neurological deficits. Extremities: Symmetric 5 x 5 power. Skin: No rashes, lesions or ulcers Psychiatry: Judgement and insight appear normal. Mood & affect appropriate.     Data Reviewed: I have personally reviewed following labs and imaging studies  CBC: Recent Labs  Lab 03/30/18 0905  WBC 6.8  HGB 12.4  HCT 39.4  MCV 92.9  PLT 295   Basic Metabolic Panel: Recent Labs  Lab 03/30/18 0905 03/31/18 0713  NA 138 142  K 2.9*  3.9  CL 108 110  CO2 26 27  GLUCOSE 82 86  BUN 18 9  CREATININE 0.54 0.44  CALCIUM 8.3* 8.7*   GFR: Estimated Creatinine Clearance: 54 mL/min (by C-G formula based on SCr of 0.44 mg/dL). Liver Function Tests: No results for input(s): AST, ALT, ALKPHOS, BILITOT, PROT, ALBUMIN in the last 168 hours. No results for input(s): LIPASE, AMYLASE in the last 168 hours. No results for input(s): AMMONIA in the last 168 hours. Coagulation Profile: No results for input(s): INR, PROTIME in the last 168 hours. Cardiac Enzymes: Recent Labs  Lab 03/30/18 1602 03/30/18 2231 03/31/18 0713  TROPONINI <0.03 <0.03 <0.03   BNP (last 3 results) No results for input(s): PROBNP in the last 8760 hours. HbA1C: No results for input(s): HGBA1C in the last 72 hours. CBG: No results for input(s): GLUCAP in the last 168 hours. Lipid Profile: Recent Labs    03/31/18 0713  CHOL 195  HDL 85  LDLCALC 101*  TRIG 47  CHOLHDL 2.3   Thyroid Function Tests: No results for input(s): TSH, T4TOTAL, FREET4, T3FREE, THYROIDAB in the last 72 hours. Anemia Panel: No results for input(s): VITAMINB12, FOLATE, FERRITIN, TIBC, IRON, RETICCTPCT in the last 72 hours. Sepsis Labs: Recent Labs  Lab 03/30/18 0920 03/30/18 1602  LATICACIDVEN 1.5 1.7    No results found for this or any previous visit (from the past 240 hour(s)).  Radiology Studies: Dg Chest 2 View  Result Date: 03/30/2018 CLINICAL DATA:  Pneumonia, chest pain EXAM: CHEST - 2 VIEW COMPARISON:  06/26/2015 chest radiograph. FINDINGS: Stable cardiomediastinal silhouette with normal heart size. No pneumothorax. No pleural effusion. No pulmonary edema. Reticular and curvilinear opacities at the lung bases are unchanged. No acute consolidative airspace disease. IMPRESSION: No acute cardiopulmonary disease. Chronic bibasilar reticular and curvilinear opacities suggesting nonspecific scarring or chronic interstitial lung disease. Electronically Signed    By: Delbert Phenix M.D.   On: 03/30/2018 09:40   Ct Angio Chest Pe W/cm &/or Wo Cm  Result Date: 03/30/2018 CLINICAL DATA:  Shortness of breath EXAM: CT ANGIOGRAPHY CHEST WITH CONTRAST TECHNIQUE: Multidetector CT imaging of the chest was performed using the standard protocol during bolus administration of intravenous contrast. Multiplanar CT image reconstructions and MIPs were obtained to evaluate the vascular anatomy. CONTRAST:  ISOVUE-370 IOPAMIDOL (ISOVUE-370) INJECTION 76% COMPARISON:  Chest radiograph March 30, 2018 FINDINGS: Cardiovascular: There is no demonstrable pulmonary embolus. There is no thoracic aortic aneurysm or dissection. Visualized great vessels appear unremarkable. There are foci of atherosclerotic calcification in the aorta. There are occasional foci of coronary artery calcification. There is no pericardial effusion or pericardial thickening evident. The main pulmonary outflow tract is prominent measuring 3.0 cm in diameter. Mediastinum/Nodes: No thyroid lesions evident. There is no appreciable thoracic adenopathy. Air is present throughout much of the esophagus. Radiopaque material is noted in the stomach and midesophagus, likely indicative of gastroesophageal reflux. Lungs/Pleura: There is a right pleural effusion with atelectatic change in the right base. There is patchy fibrotic change throughout the lungs bilaterally. There is no consolidation. Areas of patchy mosaic attenuation likely represent small airways obstructive disease. Upper Abdomen: Visualized upper abdominal structures appear unremarkable except for calcified granulomas in the spleen. Musculoskeletal: There are no blastic or lytic bone lesions. No chest wall lesions are appreciable. Review of the MIP images confirms the above findings. IMPRESSION: 1. No demonstrable pulmonary embolus. No thoracic aortic aneurysm or dissection. There is aortic atherosclerosis. There are occasional foci of coronary artery calcification. 2.  Patchy fibrosis in the lungs. Small right pleural effusion with right base atelectasis. Areas of probable small airways obstructive disease. No frank consolidation. 3.  No evident adenopathy. 4. Air throughout much of the esophagus. Evidence of spontaneous gastroesophageal reflux. 5. Main pulmonary outflow tract is prominent measuring 3.0 cm in diameter. Suspect a degree of underlying pulmonary arterial hypertension. Aortic Atherosclerosis (ICD10-I70.0). Electronically Signed   By: Bretta Bang III M.D.   On: 03/30/2018 13:21        Scheduled Meds: . amLODipine  5 mg Oral Daily  . celecoxib  200 mg Oral BID  . clopidogrel  75 mg Oral Daily  . hydroxychloroquine  400 mg Oral Daily  . leflunomide  20 mg Oral Daily  . levothyroxine  100 mcg Oral QAC breakfast  . mometasone-formoterol  2 puff Inhalation BID  . predniSONE  20 mg Oral Q breakfast   Continuous Infusions:   LOS: 0 days        Alwyn Ren, MD [facility]   If 7PM-7AM, please contact night-coverage [email or web address] Password [facility] 03/31/2018, 12:03 PM

## 2018-03-31 NOTE — Progress Notes (Signed)
Called and spoke to Cardiologist office of Dr Teresita Madura at [facility] in [place].  They are faxing information over from previous visits.  Will page MD once received.

## 2018-03-31 NOTE — Progress Notes (Signed)
Echo showed LVEF of 65-70%; mild diastolic dysfunction; mild LVH with proximal septal thickening; trace AI.  Plan as discussion in consult note.   [facility] will sign off.   Medication Recommendations:  Continue Plavix and amlodipine. LDL 101. Goal less than 70. Consider Lipitor 20mg  qd.  Other recommendations (labs, testing, etc):  none Follow up as an outpatient:  Follow up has been arranged. Can cancel if no recurrent symptoms and then follow up with regular cardiologist when return to [facility]

## 2018-03-31 NOTE — Progress Notes (Signed)
Patient and son instructed on AVS at this time.  Telemetry monitor removed and verified with technician watching monitors today.  Paged MD per patient request for Celebrex refill.  MD called backed and stated it has been sent to pharmacy.  Patient getting dressed and Clinical research associate will take to personal car.

## 2018-03-31 NOTE — Plan of Care (Signed)
  Problem: Cardiac: Goal: Ability to achieve and maintain adequate cardiovascular perfusion will improve Outcome: Progressing   Problem: Health Behavior/Discharge Planning: Goal: Ability to safely manage health-related needs after discharge will improve Outcome: Progressing   Problem: Education: Goal: Knowledge of General Education information will improve Outcome: Progressing   Problem: Health Behavior/Discharge Planning: Goal: Ability to manage health-related needs will improve Outcome: Progressing   Problem: Clinical Measurements: Goal: Ability to maintain clinical measurements within normal limits will improve Outcome: Progressing Goal: Will remain free from infection Outcome: Progressing Goal: Diagnostic test results will improve Outcome: Progressing Goal: Respiratory complications will improve Outcome: Progressing Goal: Cardiovascular complication will be avoided Outcome: Progressing   Problem: Activity: Goal: Risk for activity intolerance will decrease Outcome: Progressing   Problem: Nutrition: Goal: Adequate nutrition will be maintained Outcome: Progressing   Problem: Coping: Goal: Level of anxiety will decrease Outcome: Progressing   Problem: Elimination: Goal: Will not experience complications related to bowel motility Outcome: Progressing Goal: Will not experience complications related to urinary retention Outcome: Progressing   Problem: Pain Managment: Goal: General experience of comfort will improve Outcome: Progressing   Problem: Safety: Goal: Ability to remain free from injury will improve Outcome: Progressing   Problem: Skin Integrity: Goal: Risk for impaired skin integrity will decrease Outcome: Progressing

## 2018-03-31 NOTE — Progress Notes (Signed)
Patient discharged at this time to personal car.  Seat belt applied, son driving patient home.  No s/s of distress noted, denies pain.

## 2018-03-31 NOTE — Telephone Encounter (Signed)
New Message:      Pt has a TOC appointment on 04/30/18 at 9:00 with Bhagat per [facility]

## 2018-03-31 NOTE — Progress Notes (Signed)
hocardiogram 2D Echocardiogram has been performed.  Pieter Partridge 03/31/2018, 11:18 AM

## 2018-03-31 NOTE — Telephone Encounter (Signed)
**Note De-identified Melody Gregory Obfuscation** Left message on the pts VM asking her to call me back. 

## 2018-03-31 NOTE — Progress Notes (Signed)
Received notes from his primary cardiologist Hector L. Delman Kitten, MD from [facility]. 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 Last echo 10/2016 showed normal LVEF with mild pulmonary hypertension (31-36mm Hg) with normal RV function.   Last seen by Dr. Oswaldo Done 10/2017. Recommended yearly follow up.

## 2018-03-31 NOTE — Consult Note (Signed)
Cardiology Consultation:   Patient ID: Melody Gregory; [id number]; Oct 08, 1950   Admit date: 03/30/2018 Date of Consult: 03/31/2018  Primary Care Provider: Patient, No Pcp Per Primary Cardiologist: [place], [place]   Patient Profile:   Melody Gregory is a 67 y.o. female with a hx of pulmonary hypertension due to pulmonary fibrosis, lupus on prednisone chronically and hypertension who is being seen today for the evaluation of chest pain at the request of Dr. Jerolyn Center.  Patient sees cardiologist every 6 months for pulmonary hypertension.  Denies prior history of MI or CAD.  She endorses history of chronic right bundle branch block.  History of Present Illness:   [name] presented yesterday with acute onset chest pain.  Patient is visiting her son for past 3 weeks.  She was treated with Augmentin for pneumonia without improvement.  Again seen in urgent care Friday 7/5 due to ongoing symptoms.  Started on Levaquin and given IV drug.  She felt better on Saturday however Sunday morning 7/7 she woke up with severe substernal chest pressure.  Associated with shortness of breath and radiating to right arm.  Denies nausea, vomiting, diaphoresis or similar symptoms in past.  Due to ongoing pain for 3 to 4 hours she presented to ER.  Her symptoms improved after IV morphine.  Chest x-ray without acute disease.  CT angio of chest without pulmonary embolism or dissection.  There is aortic atherosclerosis and coronary artery calcification.  Evidence of spontaneous acid reflux.  Patient again had mild tooth discomfort on the floor which improved after Celebrex.  She endorsed chest pain with deep breath initially, now resolved.  Intermittent chest pain with laying down.  She denies orthopnea, PND, syncope, lower extremity edema, palpitation, dizziness or melena.  Past Medical History:  Diagnosis Date  . Hypertension   . Lupus (HCC)   . Pulmonary fibrosis (HCC)   . Rheumatoid arthritis (HCC)     Past  Surgical History:  Procedure Laterality Date  . right knee replacement         Inpatient Medications: Scheduled Meds: . amLODipine  5 mg Oral Daily  . celecoxib  200 mg Oral BID  . clopidogrel  75 mg Oral Daily  . hydroxychloroquine  400 mg Oral Daily  . leflunomide  20 mg Oral Daily  . levothyroxine  100 mcg Oral QAC breakfast  . mometasone-formoterol  2 puff Inhalation BID  . predniSONE  20 mg Oral Q breakfast   Continuous Infusions:  PRN Meds: alum & mag hydroxide-simeth, ipratropium  Allergies:    Allergies  Allergen Reactions  . Vancomycin Rash    Social History:   Social History   Socioeconomic History  . Marital status: Divorced    Spouse name: Not on file  . Number of children: Not on file  . Years of education: Not on file  . Highest education level: Not on file  Occupational History  . Not on file  Social Needs  . Financial resource strain: Not on file  . Food insecurity:    Worry: Not on file    Inability: Not on file  . Transportation needs:    Medical: Not on file    Non-medical: Not on file  Tobacco Use  . Smoking status: Never Smoker  Substance and Sexual Activity  . Alcohol use: No  . Drug use: No  . Sexual activity: Not on file  Lifestyle  . Physical activity:    Days per week: Not on file    Minutes per session:  Not on file  . Stress: Not on file  Relationships  . Social connections:    Talks on phone: Not on file    Gets together: Not on file    Attends religious service: Not on file    Active member of club or organization: Not on file    Attends meetings of clubs or organizations: Not on file    Relationship status: Not on file  . Intimate partner violence:    Fear of current or ex partner: Not on file    Emotionally abused: Not on file    Physically abused: Not on file    Forced sexual activity: Not on file  Other Topics Concern  . Not on file  Social History Narrative  . Not on file    Family History:   Denies family  history of CAD  ROS:  Please see the history of present illness.  All other ROS reviewed and negative.     Physical Exam/Data:   Vitals:   03/30/18 1617 03/30/18 1922 03/31/18 0454 03/31/18 0743  BP: 129/69 112/62 (!) 148/69 (!) 144/71  Pulse: 75 66 68 64  Resp:  18 (!) 22 16  Temp: 98.1 F (36.7 C) 98.1 F (36.7 C) 98.5 F (36.9 C)   TempSrc: Oral Oral Oral   SpO2: 96% 95% 94%   Weight: 114 lb (51.7 kg)  114 lb 9.6 oz (52 kg)   Height: 5\' 2"  (1.575 m)       Intake/Output Summary (Last 24 hours) at 03/31/2018 0813 Last data filed at 03/30/2018 2110 Gross per 24 hour  Intake 600 ml  Output -  Net 600 ml   Filed Weights   03/30/18 1617 03/31/18 0454  Weight: 114 lb (51.7 kg) 114 lb 9.6 oz (52 kg)   Body mass index is 20.96 kg/m.  General:  Well nourished, well developed, in no acute distress HEENT: normal Lymph: no adenopathy Neck: no JVD Endocrine:  No thryomegaly Vascular: No carotid bruits; FA pulses 2+ bilaterally without bruits  Cardiac:  normal S1, S2; RRR; no murmur Lungs: Diffuse wheezing throughout Abd: soft, nontender, no hepatomegaly  Ext: no edema Musculoskeletal:  No deformities, BUE and BLE strength normal and equal Skin: warm and dry  Neuro:  CNs 2-12 intact, no focal abnormalities noted Psych:  Normal affect   EKG:  The EKG was personally reviewed and demonstrates: Sinus rhythm with T wave inversion inferiorly and right bundle branch block Telemetry:  Telemetry was personally reviewed and demonstrates: Sinus rhythm  Relevant CV Studies: Pending echocardiogram  Laboratory Data:  Chemistry Recent Labs  Lab 03/30/18 0905  NA 138  K 2.9*  CL 108  CO2 26  GLUCOSE 82  BUN 18  CREATININE 0.54  CALCIUM 8.3*  GFRNONAA >60  GFRAA >60  ANIONGAP 4*    Hematology Recent Labs  Lab 03/30/18 0905  WBC 6.8  RBC 4.24  HGB 12.4  HCT 39.4  MCV 92.9  MCH 29.2  MCHC 31.5  RDW 13.2  PLT 295   Cardiac Enzymes Recent Labs  Lab 03/30/18 1602  03/30/18 2231  TROPONINI <0.03 <0.03    Recent Labs  Lab 03/30/18 0913  TROPIPOC 0.02    Radiology/Studies:  Dg Chest 2 View  Result Date: 03/30/2018 CLINICAL DATA:  Pneumonia, chest pain EXAM: CHEST - 2 VIEW COMPARISON:  06/26/2015 chest radiograph. FINDINGS: Stable cardiomediastinal silhouette with normal heart size. No pneumothorax. No pleural effusion. No pulmonary edema. Reticular and curvilinear opacities at the lung  bases are unchanged. No acute consolidative airspace disease. IMPRESSION: No acute cardiopulmonary disease. Chronic bibasilar reticular and curvilinear opacities suggesting nonspecific scarring or chronic interstitial lung disease. Electronically Signed   By: Delbert Phenix M.D.   On: 03/30/2018 09:40   Ct Angio Chest Pe W/cm &/or Wo Cm  Result Date: 03/30/2018 CLINICAL DATA:  Shortness of breath EXAM: CT ANGIOGRAPHY CHEST WITH CONTRAST TECHNIQUE: Multidetector CT imaging of the chest was performed using the standard protocol during bolus administration of intravenous contrast. Multiplanar CT image reconstructions and MIPs were obtained to evaluate the vascular anatomy. CONTRAST:  ISOVUE-370 IOPAMIDOL (ISOVUE-370) INJECTION 76% COMPARISON:  Chest radiograph March 30, 2018 FINDINGS: Cardiovascular: There is no demonstrable pulmonary embolus. There is no thoracic aortic aneurysm or dissection. Visualized great vessels appear unremarkable. There are foci of atherosclerotic calcification in the aorta. There are occasional foci of coronary artery calcification. There is no pericardial effusion or pericardial thickening evident. The main pulmonary outflow tract is prominent measuring 3.0 cm in diameter. Mediastinum/Nodes: No thyroid lesions evident. There is no appreciable thoracic adenopathy. Air is present throughout much of the esophagus. Radiopaque material is noted in the stomach and midesophagus, likely indicative of gastroesophageal reflux. Lungs/Pleura: There is a right pleural  effusion with atelectatic change in the right base. There is patchy fibrotic change throughout the lungs bilaterally. There is no consolidation. Areas of patchy mosaic attenuation likely represent small airways obstructive disease. Upper Abdomen: Visualized upper abdominal structures appear unremarkable except for calcified granulomas in the spleen. Musculoskeletal: There are no blastic or lytic bone lesions. No chest wall lesions are appreciable. Review of the MIP images confirms the above findings. IMPRESSION: 1. No demonstrable pulmonary embolus. No thoracic aortic aneurysm or dissection. There is aortic atherosclerosis. There are occasional foci of coronary artery calcification. 2. Patchy fibrosis in the lungs. Small right pleural effusion with right base atelectasis. Areas of probable small airways obstructive disease. No frank consolidation. 3.  No evident adenopathy. 4. Air throughout much of the esophagus. Evidence of spontaneous gastroesophageal reflux. 5. Main pulmonary outflow tract is prominent measuring 3.0 cm in diameter. Suspect a degree of underlying pulmonary arterial hypertension. Aortic Atherosclerosis (ICD10-I70.0). Electronically Signed   By: Bretta Bang III M.D.   On: 03/30/2018 13:21    Assessment and Plan:   1. Pleuritic chest pain - Failed outpatient antibiotic course for pneumonia (felt allergic in nature).  Symptoms improved after IV morphine and Celebrex.  Troponin remained negative.  CTA of the chest without PE, dissection or aneurysm.  EKG with right bundle branch block (chronic per patient) and T wave inversion in inferior lead (unknown chronicity).  Denies prior history of CAD.  Pending echocardiogram r/o effusion.   2.  CAP with pulmonary fibrosis -Wheezing noted on exam.  Evaluation by primary team.  3.  Lupus -CRP 4.8.  Sed rate normal.  4.  Hypokalemia -Resolved with supplementation.  5. Pulmonary Hypertension - Records requested   For questions or  updates, please contact [facility] Please consult [email or web address] for contact info under Cardiology/STEMI.   Lorelei Pont, [place]  03/31/2018 8:13 AM

## 2018-04-01 NOTE — Discharge Summary (Signed)
Physician Discharge Summary  Melody Gregory FYB:[id number] DOB: 1951-03-01 DOA: 03/30/2018  PCP: Patient, No Pcp Per  Admit date: 03/30/2018 Discharge date: 04/01/2018  Admitted From: Home  Disposition: Home Recommendations for Outpatient Follow-up:  1. Follow up with PCP in 1-2 weeks 2. Please obtain BMP/CBC in one week 3. Follow-up with cardiology   Home Health: None Equipment/Devices none  Discharge Condition stable CODE STATUS: Full code cardiac Diet recommendation: Brief/Interim Summary: 67 y.o. female with medical history significant of pulmonary fibrosis, lupus, rheumatoid arthritis, resides in [place] but currently visiting with her son, presents to the hospital with chief complaint of chest pain.  Patient tells me that she was diagnosed with pneumonia about a week ago when she presented to urgent care with cough and shortness of breath and sinus congestion, she has been on Augmentin for about 5 days, felt like she did not improve, went again yesterday and was changed to Levaquin.  Chest x-ray whether she tells me that she had multifocal pneumonia.  I do not have access to those results.  She comes to the hospital today due to progressive central and right-sided chest pain, worse with deep breath and which seems positional in nature.  She denies any fever chills, she denies any abdominal pain, nausea or vomiting.  She feels like her URI type symptoms with cough and congestion are a little bit better.  She denies any history of coronary artery disease.  She is on prednisone at home, 5 mg, and she self increased the dose to 10mg  given acute illness in her instructions from her PCP in the past.  ED Course: In the emergency room she is afebrile, has no leukocytosis, blood work is relatively unremarkable except for potassium of 2.9.  Lactic acid is normal, troponin is negative.  CT angiogram was negative for PE, also did not show any significant findings consistent with pneumonia.  She does have  a very small right-sided pleural effusion.    Discharge Diagnoses:  Active Problems:   Pleuritic chest pain   Pulmonary hypertension, unspecified (HCC)   Pulmonary fibrosis (HCC)   Community acquired pneumonia of right lung  1] atypical chest pain-patient's pain sounded pleuritic in nature secondary to recent pneumonia.  Echocardiogram with normal ejection fraction no wall motion abnormalities.  Patient will follow-up with cardiology while she is in if she have any further chest pain.  She is originally from [place] she will be going back in August.  I will also continue her prednisone Dosepak that she was taking at home.  2] history of rheumatoid arthritis continue prednisone  3] lupus follow-up with rheumatologist when she gets back to September.  4] pulmonary fibrosis stable at this time.  5] hypertension continue current medications.   Discharge Instructions  Discharge Instructions    Call MD for:  difficulty breathing, headache or visual disturbances   Complete by:  As directed    Call MD for:  persistant dizziness or light-headedness   Complete by:  As directed    Call MD for:  persistant nausea and vomiting   Complete by:  As directed    Call MD for:  severe uncontrolled pain   Complete by:  As directed    Diet - low sodium heart healthy   Complete by:  As directed    Increase activity slowly   Complete by:  As directed      Allergies as of 03/31/2018      Reactions   Vancomycin Rash  Medication List    STOP taking these medications   levofloxacin 750 MG tablet Commonly known as:  LEVAQUIN     TAKE these medications   AIRBORNE PO Take 1 tablet by mouth daily.   amLODipine 5 MG tablet Commonly known as:  NORVASC Take 5 mg by mouth daily.   budesonide-formoterol 160-4.5 MCG/ACT inhaler Commonly known as:  SYMBICORT Inhale 2 puffs into the lungs 2 (two) times daily.   carboxymethylcellulose 0.5 % Soln Commonly known as:  REFRESH  PLUS Place 1 drop into both eyes as needed (dry eyes).   celecoxib 200 MG capsule Commonly known as:  CELEBREX Take 1 capsule (200 mg total) by mouth 2 (two) times daily.   clopidogrel 75 MG tablet Commonly known as:  PLAVIX Take 75 mg by mouth daily.   CO-Q 10 Omega-3 Fish Oil Caps Take 1 capsule by mouth daily.   ergocalciferol 50000 units capsule Commonly known as:  VITAMIN D2 Take 50,000 Units by mouth once a week.   folic acid 1 MG tablet Commonly known as:  FOLVITE Take 1 mg by mouth daily.   HUMIRA 40 MG/0.4ML Pskt Generic drug:  Adalimumab Inject into the skin.   hydroxychloroquine 200 MG tablet Commonly known as:  PLAQUENIL Take 400 mg by mouth daily.   ipratropium 0.02 % nebulizer solution Commonly known as:  ATROVENT Take 0.5 mg by nebulization 2 (two) times daily as needed for wheezing or shortness of breath.   leflunomide 20 MG tablet Commonly known as:  ARAVA Take 20 mg by mouth daily.   levothyroxine 100 MCG tablet Commonly known as:  SYNTHROID, LEVOTHROID Take 100 mcg by mouth daily before breakfast.   OXYGEN Inhale 2 L into the lungs at bedtime.   pantoprazole 20 MG tablet Commonly known as:  PROTONIX Take 20 mg by mouth daily.   predniSONE 5 MG tablet Commonly known as:  DELTASONE Take 5 mg by mouth daily with breakfast.   rosuvastatin 10 MG tablet Commonly known as:  CRESTOR Take 0.5 tablets (5 mg total) by mouth at bedtime.      Follow-up Information    Lyle, Sharrell Ku, [place] Follow up on 04/30/2018.   Specialty:  Cardiology Why:  @9am  for hosptial cardiology follow up. Please call if needed to cancel the appoitnment to better surve other patient.  Contact information: [address] [place] [phone]          Allergies  Allergen Reactions  . Vancomycin Rash    Consultations:  [facility]   Procedures/Studies: Dg Chest 2 View  Result Date: 03/30/2018 CLINICAL DATA:  Pneumonia, chest pain  EXAM: CHEST - 2 VIEW COMPARISON:  06/26/2015 chest radiograph. FINDINGS: Stable cardiomediastinal silhouette with normal heart size. No pneumothorax. No pleural effusion. No pulmonary edema. Reticular and curvilinear opacities at the lung bases are unchanged. No acute consolidative airspace disease. IMPRESSION: No acute cardiopulmonary disease. Chronic bibasilar reticular and curvilinear opacities suggesting nonspecific scarring or chronic interstitial lung disease. Electronically Signed   By: 08/26/2015 M.D.   On: 03/30/2018 09:40   Ct Angio Chest Pe W/cm &/or Wo Cm  Result Date: 03/30/2018 CLINICAL DATA:  Shortness of breath EXAM: CT ANGIOGRAPHY CHEST WITH CONTRAST TECHNIQUE: Multidetector CT imaging of the chest was performed using the standard protocol during bolus administration of intravenous contrast. Multiplanar CT image reconstructions and MIPs were obtained to evaluate the vascular anatomy. CONTRAST:  05/31/2018 ISOVUE-370 IOPAMIDOL (ISOVUE-370) INJECTION 76% COMPARISON:  Chest radiograph March 30, 2018  FINDINGS: Cardiovascular: There is no demonstrable pulmonary embolus. There is no thoracic aortic aneurysm or dissection. Visualized great vessels appear unremarkable. There are foci of atherosclerotic calcification in the aorta. There are occasional foci of coronary artery calcification. There is no pericardial effusion or pericardial thickening evident. The main pulmonary outflow tract is prominent measuring 3.0 cm in diameter. Mediastinum/Nodes: No thyroid lesions evident. There is no appreciable thoracic adenopathy. Air is present throughout much of the esophagus. Radiopaque material is noted in the stomach and midesophagus, likely indicative of gastroesophageal reflux. Lungs/Pleura: There is a right pleural effusion with atelectatic change in the right base. There is patchy fibrotic change throughout the lungs bilaterally. There is no consolidation. Areas of patchy mosaic attenuation likely represent  small airways obstructive disease. Upper Abdomen: Visualized upper abdominal structures appear unremarkable except for calcified granulomas in the spleen. Musculoskeletal: There are no blastic or lytic bone lesions. No chest wall lesions are appreciable. Review of the MIP images confirms the above findings. IMPRESSION: 1. No demonstrable pulmonary embolus. No thoracic aortic aneurysm or dissection. There is aortic atherosclerosis. There are occasional foci of coronary artery calcification. 2. Patchy fibrosis in the lungs. Small right pleural effusion with right base atelectasis. Areas of probable small airways obstructive disease. No frank consolidation. 3.  No evident adenopathy. 4. Air throughout much of the esophagus. Evidence of spontaneous gastroesophageal reflux. 5. Main pulmonary outflow tract is prominent measuring 3.0 cm in diameter. Suspect a degree of underlying pulmonary arterial hypertension. Aortic Atherosclerosis (ICD10-I70.0). Electronically Signed   By: Bretta Bang III M.D.   On: 03/30/2018 13:21    (Echo, Carotid, EGD, Colonoscopy, ERCP)    Subjective:   Discharge Exam: Vitals:   03/31/18 0743 03/31/18 1501  BP: (!) 144/71 (!) 113/58  Pulse: 64 70  Resp: 16   Temp:  98.7 F (37.1 C)  SpO2:     Vitals:   03/30/18 1922 03/31/18 0454 03/31/18 0743 03/31/18 1501  BP: 112/62 (!) 148/69 (!) 144/71 (!) 113/58  Pulse: 66 68 64 70  Resp: 18 (!) 22 16   Temp: 98.1 F (36.7 C) 98.5 F (36.9 C)  98.7 F (37.1 C)  TempSrc: Oral Oral  Oral  SpO2: 95% 94%    Weight:  52 kg (114 lb 9.6 oz)    Height:        General: Pt is alert, awake, not in acute distress Cardiovascular: RRR, S1/S2 +, no rubs, no gallops Respiratory: CTA bilaterally, no wheezing, no rhonchi Abdominal: Soft, NT, ND, bowel sounds + Extremities: no edema, no cyanosis    The results of significant diagnostics from this hospitalization (including imaging, microbiology, ancillary and laboratory) are  listed below for reference.     Microbiology: Recent Results (from the past 240 hour(s))  Culture, blood (routine x 2)     Status: None (Preliminary result)   Collection Time: 03/30/18  9:20 AM  Result Value Ref Range Status   Specimen Description BLOOD RIGHT ANTECUBITAL  Final   Special Requests   Final    BOTTLES DRAWN AEROBIC AND ANAEROBIC Blood Culture adequate volume   Culture   Final    NO GROWTH 1 DAY Performed at [facility], [address] [place], [place] [address]    Report Status PENDING  Incomplete  Culture, blood (routine x 2)     Status: None (Preliminary result)   Collection Time: 03/30/18  9:25 AM  Result Value Ref Range Status   Specimen Description BLOOD BLOOD RIGHT FOREARM  Final   Special Requests   Final    BOTTLES DRAWN AEROBIC AND ANAEROBIC Blood Culture adequate volume   Culture   Final    NO GROWTH 1 DAY Performed at [facility], [address], [place], [place] [address]    Report Status PENDING  Incomplete     Labs: BNP (last 3 results) No results for input(s): BNP in the last 8760 hours. Basic Metabolic Panel: Recent Labs  Lab 03/30/18 0905 03/31/18 0713  NA 138 142  K 2.9* 3.9  CL 108 110  CO2 26 27  GLUCOSE 82 86  BUN 18 9  CREATININE 0.54 0.44  CALCIUM 8.3* 8.7*   Liver Function Tests: No results for input(s): AST, ALT, ALKPHOS, BILITOT, PROT, ALBUMIN in the last 168 hours. No results for input(s): LIPASE, AMYLASE in the last 168 hours. No results for input(s): AMMONIA in the last 168 hours. CBC: Recent Labs  Lab 03/30/18 0905  WBC 6.8  HGB 12.4  HCT 39.4  MCV 92.9  PLT 295   Cardiac Enzymes: Recent Labs  Lab 03/30/18 1602 03/30/18 2231 03/31/18 0713  TROPONINI <0.03 <0.03 <0.03   BNP: Invalid input(s): POCBNP CBG: No results for input(s): GLUCAP in the last 168 hours. D-Dimer No results for input(s): DDIMER in the last 72 hours. Hgb A1c No results for input(s): HGBA1C in the last 72  hours. Lipid Profile Recent Labs    03/31/18 0713  CHOL 195  HDL 85  LDLCALC 101*  TRIG 47  CHOLHDL 2.3   Thyroid function studies No results for input(s): TSH, T4TOTAL, T3FREE, THYROIDAB in the last 72 hours.  Invalid input(s): FREET3 Anemia work up No results for input(s): VITAMINB12, FOLATE, FERRITIN, TIBC, IRON, RETICCTPCT in the last 72 hours. Urinalysis No results found for: COLORURINE, APPEARANCEUR, LABSPEC, PHURINE, GLUCOSEU, HGBUR, BILIRUBINUR, KETONESUR, PROTEINUR, UROBILINOGEN, NITRITE, LEUKOCYTESUR Sepsis Labs Invalid input(s): PROCALCITONIN,  WBC,  LACTICIDVEN Microbiology Recent Results (from the past 240 hour(s))  Culture, blood (routine x 2)     Status: None (Preliminary result)   Collection Time: 03/30/18  9:20 AM  Result Value Ref Range Status   Specimen Description BLOOD RIGHT ANTECUBITAL  Final   Special Requests   Final    BOTTLES DRAWN AEROBIC AND ANAEROBIC Blood Culture adequate volume   Culture   Final    NO GROWTH 1 DAY Performed at [facility], [address] [place], [place] [address]    Report Status PENDING  Incomplete  Culture, blood (routine x 2)     Status: None (Preliminary result)   Collection Time: 03/30/18  9:25 AM  Result Value Ref Range Status   Specimen Description BLOOD BLOOD RIGHT FOREARM  Final   Special Requests   Final    BOTTLES DRAWN AEROBIC AND ANAEROBIC Blood Culture adequate volume   Culture   Final    NO GROWTH 1 DAY Performed at [facility], [address], [place], [place] [address]    Report Status PENDING  Incomplete     Time coordinating discharge: 34 minutes  SIGNED:   Alwyn Ren, MD  [facility] 04/01/2018, 12:55 PM Pager   If 7PM-7AM, please contact night-coverage [email or web address] Password [email or web address]

## 2018-04-01 NOTE — Telephone Encounter (Signed)
LMTCB

## 2018-04-04 LAB — CULTURE, BLOOD (ROUTINE X 2)
CULTURE: NO GROWTH
CULTURE: NO GROWTH
SPECIAL REQUESTS: ADEQUATE
SPECIAL REQUESTS: ADEQUATE

## 2018-04-30 ENCOUNTER — Encounter (INDEPENDENT_AMBULATORY_CARE_PROVIDER_SITE_OTHER): Payer: Self-pay

## 2018-04-30 ENCOUNTER — Ambulatory Visit: Payer: Medicare Other | Admitting: Physician Assistant

## 2018-04-30 ENCOUNTER — Encounter: Payer: Self-pay | Admitting: Physician Assistant

## 2018-04-30 VITALS — BP 124/76 | HR 70 | Ht 63.0 in | Wt 114.2 lb

## 2018-04-30 DIAGNOSIS — I272 Pulmonary hypertension, unspecified: Secondary | ICD-10-CM | POA: Diagnosis not present

## 2018-04-30 DIAGNOSIS — J8410 Pulmonary fibrosis, unspecified: Secondary | ICD-10-CM

## 2018-04-30 DIAGNOSIS — J42 Unspecified chronic bronchitis: Secondary | ICD-10-CM

## 2018-04-30 DIAGNOSIS — R0789 Other chest pain: Secondary | ICD-10-CM | POA: Diagnosis not present

## 2018-04-30 DIAGNOSIS — J209 Acute bronchitis, unspecified: Secondary | ICD-10-CM

## 2018-04-30 MED ORDER — DOXYCYCLINE HYCLATE 100 MG PO TABS: 100.0000 mg | ORAL_TABLET | Freq: Two times a day (BID) | ORAL | 0 refills | Status: AC

## 2018-04-30 NOTE — Progress Notes (Signed)
Cardiology Office Note    Date:  04/30/2018   ID:  Melody Gregory, DOB 02-15-1951, MRN [id number]  PCP:  Patient, No Pcp Per  Cardiologist: Dannial Monarch. Delman Kitten, MD from [facility], [place], [place]   Chief Complaint: Hospital follow up   History of Present Illness:   Melody Gregory is a 67 y.o. female with a hx of pulmonary hypertension due to pulmonary fibrosis, lupus on prednisone chronically and hypertension presents for hospital follow up for chest pain.   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 Last echo 10/2016 showed normal LVEF with mild pulmonary hypertension (31-35mm Hg) with normal RV function.   Visiting family here. Failed outpatient abx for pneumonia. Admitted 03/2017 for pleuritic chest pain. Symptoms improved after IV morphine and Celebrex.  Troponin remained negative.  CTA of the chest without PE, dissection or aneurysm. Echo showed LVEF of 65-70%; mild diastolic dysfunction; mild LVHwith proximal septal thickening; trace AI.  Here today for follow up. Recovered from recent admission as above.  No recurrent chest pain.  However developed with for 5 to 7 days.  Has changed and nasal congestion.  She thinks this is due to her pulmonary fibrosis, as well as exposure to cold from drying some and dog at home. she does not have exposure to dog in [place].  Denies chest pain, palpitation, orthopnea, PND, syncope or lower extremity edema.  Compliant with medication.  Denies fever or chills.  Having greenish-yellow sputum.  Past Medical History:  Diagnosis Date  . Hypertension   . Lupus (HCC)   . Pulmonary fibrosis (HCC)   . Rheumatoid arthritis (HCC)     Past Surgical History:  Procedure Laterality Date  . right knee replacement      Current Medications: Prior to Admission medications   Medication Sig Start Date End Date Taking? Authorizing Provider  Adalimumab (HUMIRA) 40 MG/0.4ML PSKT  Inject into the skin.    [provider]  amLODipine (NORVASC) 5 MG tablet Take 5 mg by mouth daily.    [provider]  budesonide-formoterol (SYMBICORT) 160-4.5 MCG/ACT inhaler Inhale 2 puffs into the lungs 2 (two) times daily.    [provider]  carboxymethylcellulose (REFRESH PLUS) 0.5 % SOLN Place 1 drop into both eyes as needed (dry eyes).    [provider]  celecoxib (CELEBREX) 200 MG capsule Take 1 capsule (200 mg total) by mouth 2 (two) times daily. 03/31/18   Alwyn Ren, MD  clopidogrel (PLAVIX) 75 MG tablet Take 75 mg by mouth daily.    [provider]  Coenzyme Q10-Fish Oil-Vit E (CO-Q 10 OMEGA-3 FISH OIL) CAPS Take 1 capsule by mouth daily.    [provider]  ergocalciferol (VITAMIN D2) 50000 units capsule Take 50,000 Units by mouth once a week.    [provider]  folic acid (FOLVITE) 1 MG tablet Take 1 mg by mouth daily.    [provider]  hydroxychloroquine (PLAQUENIL) 200 MG tablet Take 400 mg by mouth daily.     [provider]  ipratropium (ATROVENT) 0.02 % nebulizer solution Take 0.5 mg by nebulization 2 (two) times daily as needed for wheezing or shortness of breath.    [provider]  leflunomide (ARAVA) 20 MG tablet Take 20 mg by mouth daily.    [provider]  levothyroxine (SYNTHROID, LEVOTHROID) 100 MCG tablet Take 100 mcg by mouth daily before breakfast.    [provider]  Multiple Vitamins-Minerals (AIRBORNE PO) Take 1 tablet by mouth daily.    [provider]  OXYGEN Inhale 2 L into the lungs at bedtime.    [provider]  pantoprazole (PROTONIX) 20 MG tablet Take 20 mg by mouth daily.    [provider]  predniSONE (DELTASONE) 5 MG tablet Take 5 mg by mouth daily with breakfast.    [provider]  rosuvastatin (CRESTOR) 10 MG tablet Take 0.5 tablets (5 mg total) by mouth at bedtime. 03/31/18 03/31/19  Alwyn Ren, MD    Allergies:   Vancomycin   Social History   Socioeconomic History  . Marital status: Divorced    Spouse name: Not on file  . Number of children: Not on file  . Years of education: Not on file  . Highest education level: Not on file  Occupational History  . Not on file  Social Needs  . Financial resource strain: Not on file  . Food insecurity:    Worry: Not on file    Inability: Not on file  . Transportation needs:    Medical: Not on file    Non-medical: Not on file  Tobacco Use  . Smoking status: Never Smoker  . Smokeless tobacco: Never Used  Substance and Sexual Activity  . Alcohol use: No  . Drug use: No  . Sexual activity: Not on file  Lifestyle  . Physical activity:    Days per week: Not on file    Minutes per session: Not on file  . Stress: Not on file  Relationships  . Social connections:    Talks on phone: Not on file    Gets together: Not on file    Attends religious service: Not on file    Active member of club or organization: Not on file    Attends meetings of clubs or organizations: Not on file    Relationship status: Not on file  Other Topics Concern  . Not on file  Social History Narrative  . Not on file     Family History:  The patient's family history is not on file.   ROS:   Please see the history of present illness.    ROS All other systems reviewed and are negative.   PHYSICAL EXAM:   VS:  BP 124/76   Pulse 70   Ht 5\' 3"  (1.6 m)   Wt 114 lb 3.2 oz (51.8 kg)   SpO2 90%   BMI 20.23 kg/m    GEN: Well nourished, well developed, in no acute distress  HEENT: normal  Neck: no JVD, carotid bruits, or masses Cardiac: RRR; no murmurs, rubs, or gallops,no edema  Respiratory: Faint bibasilar Rales  GI: soft, nontender, nondistended, + BS MS: no deformity or atrophy  Skin: warm and dry, no rash Neuro:  Alert and Oriented x 3, Strength and sensation are intact Psych: euthymic mood, full affect  Wt Readings from Last 3  Encounters:  04/30/18 114 lb 3.2 oz (51.8 kg)  03/31/18 114 lb 9.6 oz (52 kg)      Studies/Labs Reviewed:   EKG:  EKG is not ordered today.  Recent Labs: 03/30/2018: Hemoglobin 12.4; Platelets 295 03/31/2018: BUN 9; Creatinine, Ser 0.44; Potassium 3.9; Sodium 142   Lipid Panel    Component Value Date/Time   CHOL 195 03/31/2018 0713   TRIG 47 03/31/2018 0713   HDL 85 03/31/2018 0713   CHOLHDL 2.3 03/31/2018 0713   VLDL 9 03/31/2018 0713  LDLCALC 101 (H) 03/31/2018 0109    Additional studies/ records that were reviewed today include:   Echocardiogram: 03/31/18 Study Conclusions  - Left ventricle: The cavity size was normal. Wall thickness was   increased in a pattern of mild LVH. There was mild focal basal   hypertrophy of the septum. Systolic function was vigorous. The   estimated ejection fraction was in the range of 65% to 70%. Wall   motion was normal; there were no regional wall motion   abnormalities. Doppler parameters are consistent with abnormal   left ventricular relaxation (grade 1 diastolic dysfunction). - Aortic valve: There was trivial regurgitation.  Impressions:  - Hyperdynamic LV function; mild diastolic dysfunction; mild LVH   with proximal septal thickening; trace AI.    ASSESSMENT & PLAN:    1. Acute on chronic bronchitis She was recovered from pneumonia however developed a cough and congestion for last 5 to 7 days.  Likely due to combination from pulmonary fibrosis, exposure to dog (allergy) or cold from grandchildren.  He usually gets prophylactic doxycycline will give her prescription in case she needs it.  Going to [employer].  Advised to seek medical attention if no improvement of symptoms.  2. Chest pain - Resolved  Medication Adjustments/Labs and Tests Ordered: Current medicines are reviewed at length with the patient today.  Concerns regarding medicines are outlined above.  Medication changes, Labs and Tests ordered today are listed in  the Patient Instructions below. Patient Instructions  Medication Instructions:  Your physician recommends that you continue on your current medications as directed. Please refer to the Current Medication list given to you today.   Labwork: None ordered  Testing/Procedures: None ordered  Follow-Up: Your physician recommends that you schedule a follow-up appointment in: FOLLOW UP WITH YOUR PRIMARY CARDIOLOGIST   Any Other Special Instructions Will Be Listed Below (If Applicable).     If you need a refill on your cardiac medications before your next appointment, please call your pharmacy.      Lorelei Pont, [place]  04/30/2018 9:31 AM    [facility] [address] [place], [place], [place]  [address] Phone: [phone]; Fax: [phone]

## 2018-04-30 NOTE — Patient Instructions (Addendum)
Medication Instructions:  Your physician recommends that you continue on your current medications as directed. Please refer to the Current Medication list given to you today.   Labwork: None ordered  Testing/Procedures: None ordered  Follow-Up: Your physician recommends that you schedule a follow-up appointment in: FOLLOW UP WITH YOUR PRIMARY CARDIOLOGIST   Any Other Special Instructions Will Be Listed Below (If Applicable).     If you need a refill on your cardiac medications before your next appointment, please call your pharmacy.

## 2019-12-15 IMAGING — CT CT ANGIO CHEST
2 of 6 series · 18 of 36 positions shown · IV contrast (iopamidol)
Comparison: Chest radiograph March 30, 2018

CLINICAL DATA: Shortness of breath

EXAM:
CT ANGIOGRAPHY CHEST WITH CONTRAST
TECHNIQUE: Multidetector CT imaging of the chest was performed using the
standard protocol during bolus administration of intravenous
contrast. Multiplanar CT image reconstructions and MIPs were
obtained to evaluate the vascular anatomy.
CONTRAST:  100mL 2U3RWD-00A IOPAMIDOL (2U3RWD-00A) INJECTION 76%

[Series 7: pe thins · axial · 0.60mm/px · z∈[+993,+1224]mm · 17 of 261 slices shown]
[im 15/261  lung]
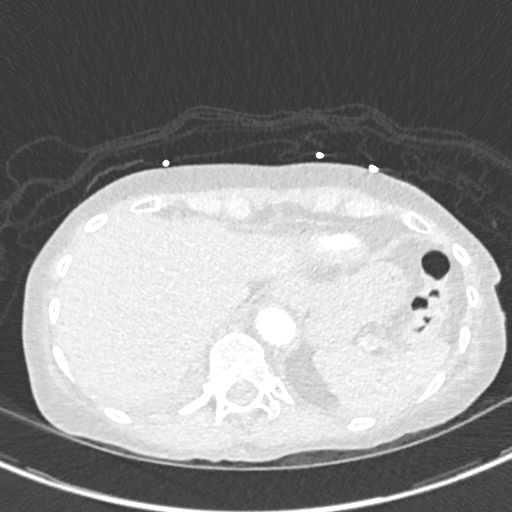
[im 29/261  mediastinal]
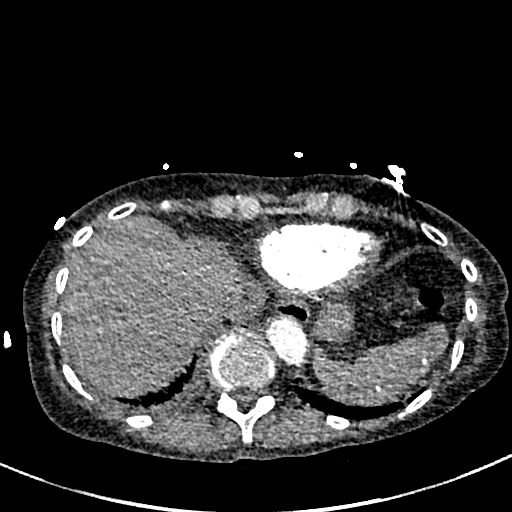
[im 44/261  lung]
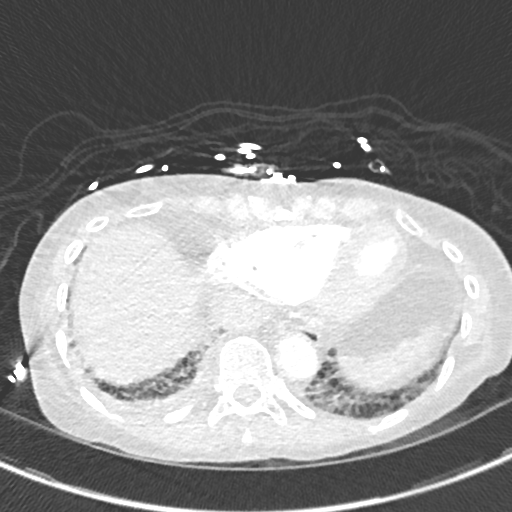
[im 58/261  mediastinal]
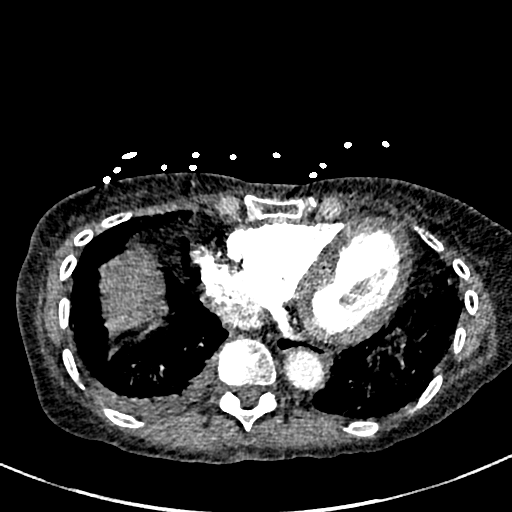
[im 73/261  lung]
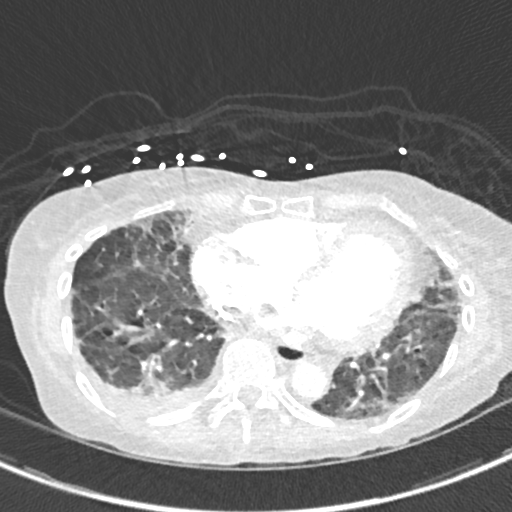
[im 87/261  mediastinal]
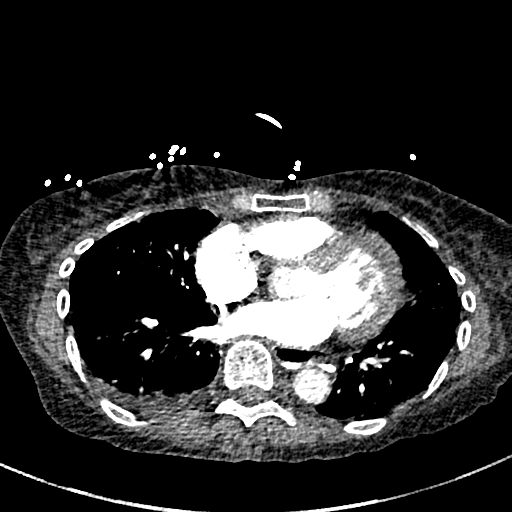
[im 102/261  lung]
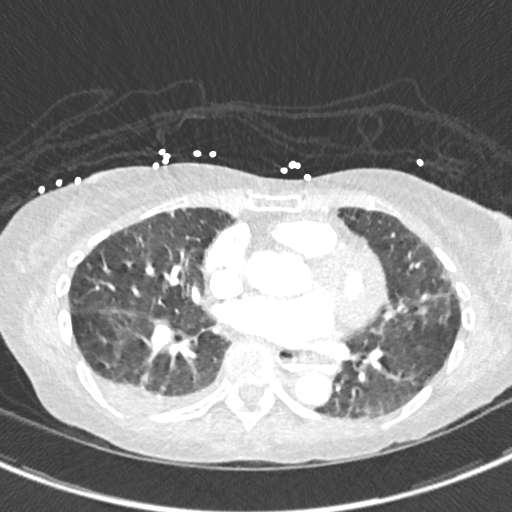
[im 116/261  mediastinal]
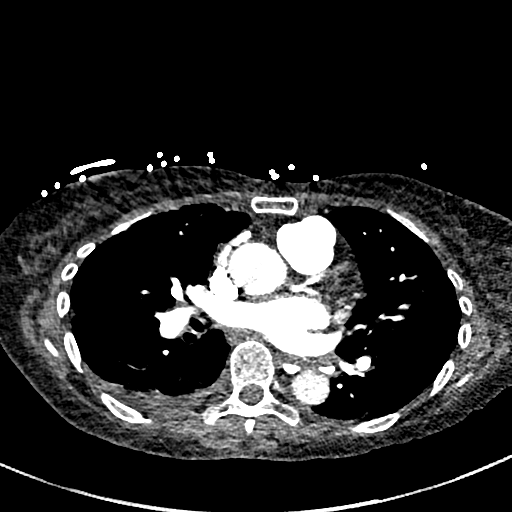
[im 131/261  lung]
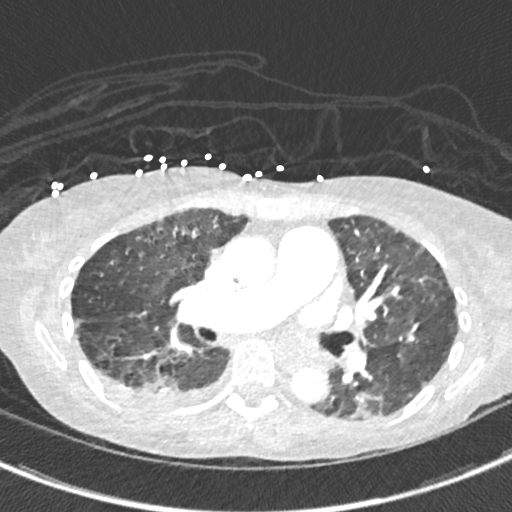
[im 145/261  mediastinal]
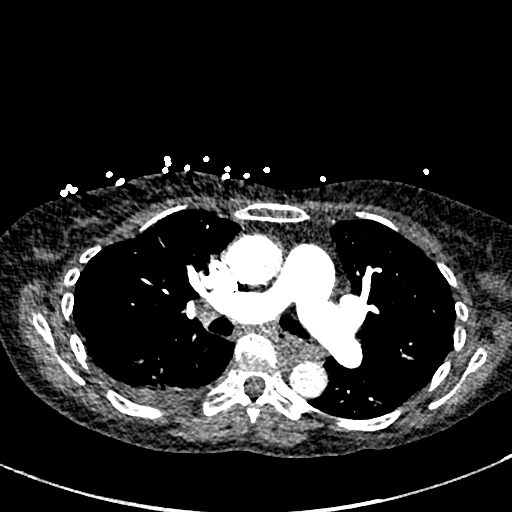
[im 159/261  lung]
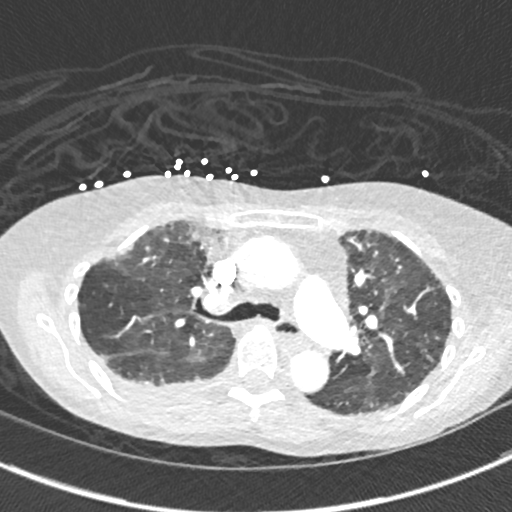
[im 174/261  mediastinal]
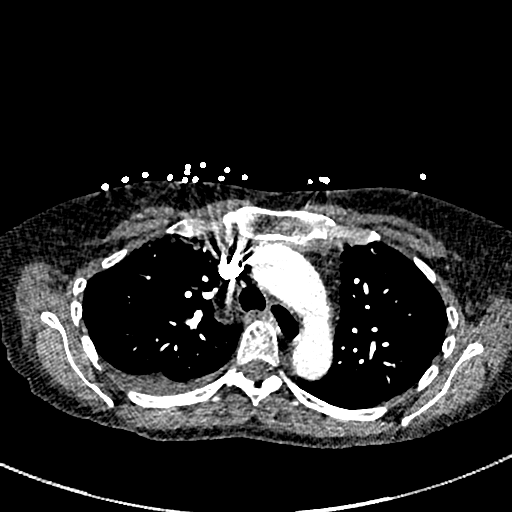
[im 188/261  lung]
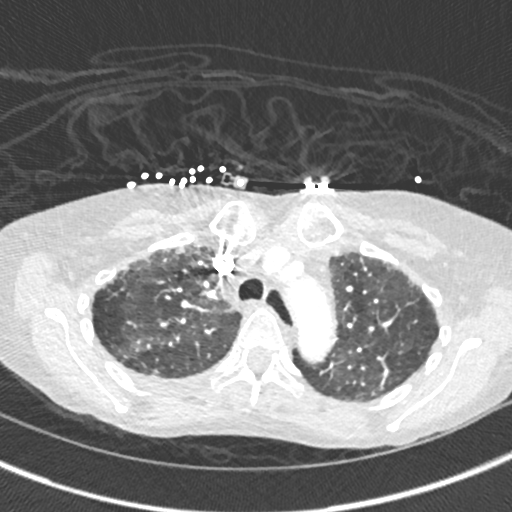
[im 203/261  mediastinal]
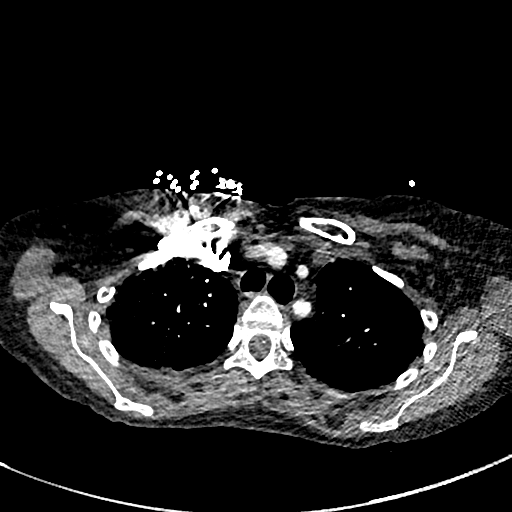
[im 217/261  lung]
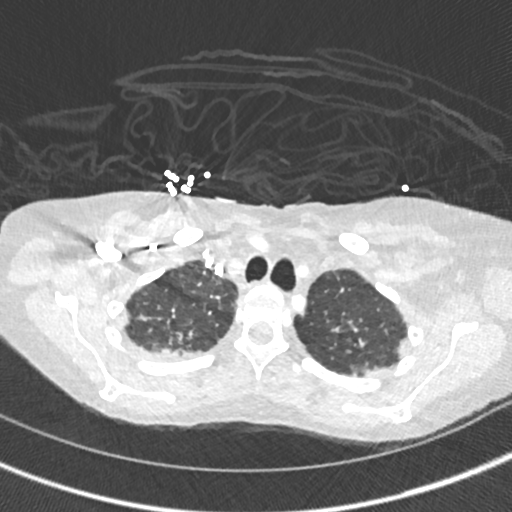
[im 232/261  mediastinal]
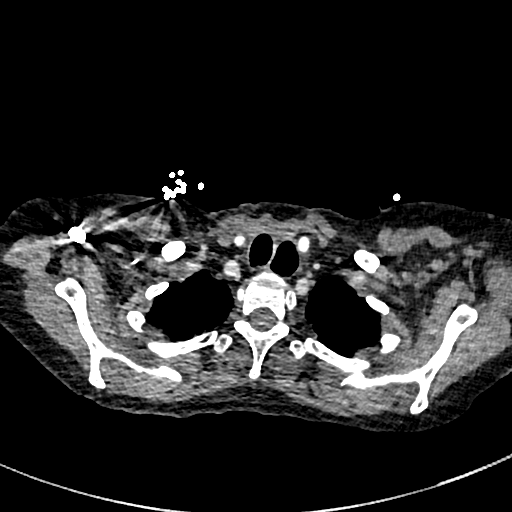
[im 246/261  lung]
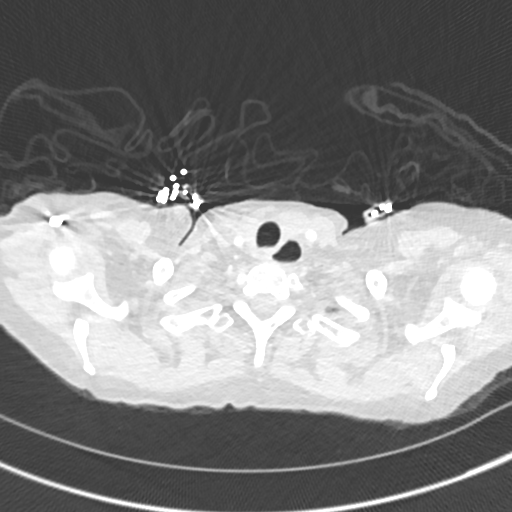

[Series 8: pe 2mm cor · coronal · 0.54mm/px · 1 of 138 slices shown]
[im 69/138  mediastinal]
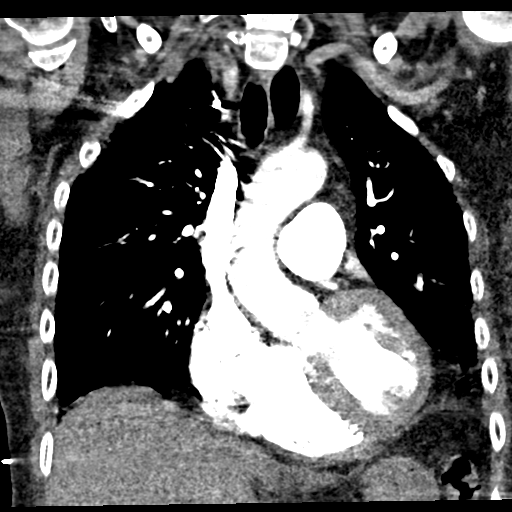

[18 of 36 positions shown; findings below may reference images not displayed]

FINDINGS: Cardiovascular: There is no demonstrable pulmonary embolus. There is
no thoracic aortic aneurysm or dissection. Visualized great vessels
appear unremarkable. There are foci of atherosclerotic calcification
in the aorta. There are occasional foci of coronary artery
calcification. There is no pericardial effusion or pericardial
thickening evident.

The main pulmonary outflow tract is prominent measuring 3.0 cm in
diameter.

Mediastinum/Nodes: No thyroid lesions evident. There is no
appreciable thoracic adenopathy. Air is present throughout much of
the esophagus. Radiopaque material is noted in the stomach and
midesophagus, likely indicative of gastroesophageal reflux.

Lungs/Pleura: There is a right pleural effusion with atelectatic
change in the right base. There is patchy fibrotic change throughout
the lungs bilaterally. There is no consolidation. Areas of patchy
mosaic attenuation likely represent small airways obstructive
disease.

Upper Abdomen: Visualized upper abdominal structures appear
unremarkable except for calcified granulomas in the spleen.

Musculoskeletal: There are no blastic or lytic bone lesions. No
chest wall lesions are appreciable.

Review of the MIP images confirms the above findings.
IMPRESSION: 1. No demonstrable pulmonary embolus. No thoracic aortic aneurysm or
dissection. There is aortic atherosclerosis. There are occasional
foci of coronary artery calcification.

2. Patchy fibrosis in the lungs. Small right pleural effusion with
right base atelectasis. Areas of probable small airways obstructive
disease. No frank consolidation.

3.  No evident adenopathy.

4. Air throughout much of the esophagus. Evidence of spontaneous
gastroesophageal reflux.

5. Main pulmonary outflow tract is prominent measuring 3.0 cm in
diameter. Suspect a degree of underlying pulmonary arterial
hypertension.

Aortic Atherosclerosis (E0MP8-F92.2).
